# Patient Record
Sex: Male | Born: 1955 | Race: Black or African American | Hispanic: No | Marital: Married | Smoking: Former smoker
Health system: Southern US, Community
[De-identification: ages and names within clinical notes are randomized; demographics above are authoritative.]

## PROBLEM LIST (undated history)

## (undated) DIAGNOSIS — B182 Chronic viral hepatitis C: Secondary | ICD-10-CM

## (undated) DIAGNOSIS — R55 Syncope and collapse: Secondary | ICD-10-CM

## (undated) DIAGNOSIS — E0590 Thyrotoxicosis, unspecified without thyrotoxic crisis or storm: Secondary | ICD-10-CM

## (undated) DIAGNOSIS — R42 Dizziness and giddiness: Secondary | ICD-10-CM

## (undated) DIAGNOSIS — J449 Chronic obstructive pulmonary disease, unspecified: Secondary | ICD-10-CM

## (undated) DIAGNOSIS — B1920 Unspecified viral hepatitis C without hepatic coma: Secondary | ICD-10-CM

## (undated) DIAGNOSIS — I509 Heart failure, unspecified: Secondary | ICD-10-CM

## (undated) DIAGNOSIS — D696 Thrombocytopenia, unspecified: Secondary | ICD-10-CM

## (undated) HISTORY — DX: Dizziness and giddiness: R42

## (undated) HISTORY — DX: Syncope and collapse: R55

## (undated) HISTORY — PX: POLYPECTOMY: SHX149

## (undated) HISTORY — DX: Unspecified viral hepatitis C without hepatic coma: B19.20

---

## 2003-10-22 ENCOUNTER — Emergency Department (HOSPITAL_COMMUNITY): Admission: EM | Admit: 2003-10-22 | Discharge: 2003-10-22 | Payer: Self-pay | Admitting: Family Medicine

## 2003-10-28 ENCOUNTER — Encounter: Admission: RE | Admit: 2003-10-28 | Discharge: 2003-10-28 | Payer: Self-pay | Admitting: Internal Medicine

## 2003-11-11 ENCOUNTER — Ambulatory Visit (HOSPITAL_COMMUNITY): Admission: RE | Admit: 2003-11-11 | Discharge: 2003-11-11 | Payer: Self-pay | Admitting: Internal Medicine

## 2004-03-10 ENCOUNTER — Emergency Department (HOSPITAL_COMMUNITY): Admission: EM | Admit: 2004-03-10 | Discharge: 2004-03-10 | Payer: Self-pay | Admitting: Emergency Medicine

## 2004-03-22 ENCOUNTER — Ambulatory Visit (HOSPITAL_COMMUNITY): Admission: RE | Admit: 2004-03-22 | Discharge: 2004-03-22 | Payer: Self-pay | Admitting: Internal Medicine

## 2006-05-23 ENCOUNTER — Ambulatory Visit (HOSPITAL_COMMUNITY): Admission: RE | Admit: 2006-05-23 | Discharge: 2006-05-23 | Payer: Self-pay | Admitting: Gastroenterology

## 2006-05-23 ENCOUNTER — Encounter (INDEPENDENT_AMBULATORY_CARE_PROVIDER_SITE_OTHER): Payer: Self-pay | Admitting: *Deleted

## 2006-08-01 ENCOUNTER — Ambulatory Visit: Payer: Self-pay | Admitting: Internal Medicine

## 2007-01-23 ENCOUNTER — Ambulatory Visit: Payer: Self-pay | Admitting: Endocrinology

## 2007-02-04 ENCOUNTER — Encounter: Admission: RE | Admit: 2007-02-04 | Discharge: 2007-02-04 | Payer: Self-pay | Admitting: Endocrinology

## 2007-06-06 ENCOUNTER — Emergency Department (HOSPITAL_COMMUNITY): Admission: EM | Admit: 2007-06-06 | Discharge: 2007-06-06 | Payer: Self-pay | Admitting: Family Medicine

## 2007-08-04 ENCOUNTER — Ambulatory Visit: Payer: Self-pay | Admitting: Internal Medicine

## 2007-08-04 DIAGNOSIS — J45901 Unspecified asthma with (acute) exacerbation: Secondary | ICD-10-CM | POA: Insufficient documentation

## 2007-08-04 DIAGNOSIS — J449 Chronic obstructive pulmonary disease, unspecified: Secondary | ICD-10-CM

## 2007-08-12 ENCOUNTER — Telehealth (INDEPENDENT_AMBULATORY_CARE_PROVIDER_SITE_OTHER): Payer: Self-pay | Admitting: *Deleted

## 2007-12-08 ENCOUNTER — Telehealth (INDEPENDENT_AMBULATORY_CARE_PROVIDER_SITE_OTHER): Payer: Self-pay | Admitting: *Deleted

## 2008-02-01 ENCOUNTER — Emergency Department (HOSPITAL_COMMUNITY): Admission: EM | Admit: 2008-02-01 | Discharge: 2008-02-01 | Payer: Self-pay | Admitting: Family Medicine

## 2008-02-05 ENCOUNTER — Emergency Department (HOSPITAL_COMMUNITY): Admission: EM | Admit: 2008-02-05 | Discharge: 2008-02-05 | Payer: Self-pay | Admitting: Emergency Medicine

## 2008-06-03 ENCOUNTER — Encounter (HOSPITAL_COMMUNITY): Admission: RE | Admit: 2008-06-03 | Discharge: 2008-06-15 | Payer: Self-pay | Admitting: Endocrinology

## 2008-06-09 ENCOUNTER — Emergency Department (HOSPITAL_COMMUNITY): Admission: EM | Admit: 2008-06-09 | Discharge: 2008-06-09 | Payer: Self-pay | Admitting: Emergency Medicine

## 2008-06-24 ENCOUNTER — Ambulatory Visit: Payer: Self-pay | Admitting: Internal Medicine

## 2008-06-24 ENCOUNTER — Ambulatory Visit: Payer: Self-pay

## 2008-06-24 ENCOUNTER — Inpatient Hospital Stay (HOSPITAL_COMMUNITY): Admission: AD | Admit: 2008-06-24 | Discharge: 2008-06-30 | Payer: Self-pay | Admitting: Internal Medicine

## 2008-06-24 ENCOUNTER — Encounter: Payer: Self-pay | Admitting: Internal Medicine

## 2008-06-24 LAB — CONVERTED CEMR LAB
ALT: 32 units/L (ref 0–53)
AST: 32 units/L (ref 0–37)
Albumin: 3.3 g/dL — ABNORMAL LOW (ref 3.5–5.2)
Alkaline Phosphatase: 115 units/L (ref 39–117)
BUN: 16 mg/dL (ref 6–23)
Basophils Absolute: 0 10*3/uL (ref 0.0–0.1)
Basophils Relative: 0 % (ref 0.0–3.0)
Bilirubin, Direct: 0.5 mg/dL — ABNORMAL HIGH (ref 0.0–0.3)
CO2: 29 meq/L (ref 19–32)
Calcium: 9.5 mg/dL (ref 8.4–10.5)
Chloride: 106 meq/L (ref 96–112)
Creatinine, Ser: 0.5 mg/dL (ref 0.4–1.5)
Eosinophils Absolute: 0 10*3/uL (ref 0.0–0.7)
Eosinophils Relative: 0.9 % (ref 0.0–5.0)
GFR calc Af Amer: 224 mL/min
GFR calc non Af Amer: 185 mL/min
Glucose, Bld: 103 mg/dL — ABNORMAL HIGH (ref 70–99)
HCT: 31.6 % — ABNORMAL LOW (ref 39.0–52.0)
Hemoglobin: 10.8 g/dL — ABNORMAL LOW (ref 13.0–17.0)
Lymphocytes Relative: 34.2 % (ref 12.0–46.0)
MCHC: 34.3 g/dL (ref 30.0–36.0)
MCV: 88.9 fL (ref 78.0–100.0)
Monocytes Absolute: 0.1 10*3/uL (ref 0.1–1.0)
Monocytes Relative: 2 % — ABNORMAL LOW (ref 3.0–12.0)
Neutro Abs: 3 10*3/uL (ref 1.4–7.7)
Neutrophils Relative %: 62.9 % (ref 43.0–77.0)
Platelets: 47 10*3/uL — CL (ref 150–400)
Potassium: 4 meq/L (ref 3.5–5.1)
Pro B Natriuretic peptide (BNP): 700 pg/mL — ABNORMAL HIGH (ref 0.0–100.0)
RBC: 3.55 M/uL — ABNORMAL LOW (ref 4.22–5.81)
RDW: 14.3 % (ref 11.5–14.6)
Sodium: 143 meq/L (ref 135–145)
TSH: 0.02 microintl units/mL — ABNORMAL LOW (ref 0.35–5.50)
Total Bilirubin: 2.4 mg/dL — ABNORMAL HIGH (ref 0.3–1.2)
Total Protein: 5.6 g/dL — ABNORMAL LOW (ref 6.0–8.3)
WBC: 4.7 10*3/uL (ref 4.5–10.5)

## 2008-06-25 ENCOUNTER — Ambulatory Visit: Payer: Self-pay | Admitting: Oncology

## 2008-06-25 ENCOUNTER — Encounter: Payer: Self-pay | Admitting: Internal Medicine

## 2008-07-02 ENCOUNTER — Ambulatory Visit: Payer: Self-pay | Admitting: Cardiology

## 2008-07-06 ENCOUNTER — Ambulatory Visit: Payer: Self-pay | Admitting: Cardiology

## 2008-07-06 ENCOUNTER — Ambulatory Visit: Payer: Self-pay | Admitting: Internal Medicine

## 2008-07-06 LAB — CONVERTED CEMR LAB
ALT: 37 units/L (ref 0–53)
AST: 31 units/L (ref 0–37)
Albumin: 3.5 g/dL (ref 3.5–5.2)
Alkaline Phosphatase: 99 units/L (ref 39–117)
BUN: 14 mg/dL (ref 6–23)
Bilirubin, Direct: 0.6 mg/dL — ABNORMAL HIGH (ref 0.0–0.3)
CO2: 31 meq/L (ref 19–32)
Calcium: 9.6 mg/dL (ref 8.4–10.5)
Chloride: 104 meq/L (ref 96–112)
Creatinine, Ser: 0.7 mg/dL (ref 0.4–1.5)
GFR calc Af Amer: 152 mL/min
GFR calc non Af Amer: 125 mL/min
Glucose, Bld: 97 mg/dL (ref 70–99)
Potassium: 4.3 meq/L (ref 3.5–5.1)
Pro B Natriuretic peptide (BNP): 623 pg/mL — ABNORMAL HIGH (ref 0.0–100.0)
Sodium: 141 meq/L (ref 135–145)
Total Bilirubin: 4.7 mg/dL — ABNORMAL HIGH (ref 0.3–1.2)
Total Protein: 5.6 g/dL — ABNORMAL LOW (ref 6.0–8.3)

## 2008-07-08 ENCOUNTER — Ambulatory Visit: Payer: Self-pay | Admitting: Internal Medicine

## 2008-07-12 ENCOUNTER — Ambulatory Visit: Payer: Self-pay | Admitting: Internal Medicine

## 2008-07-12 ENCOUNTER — Ambulatory Visit: Payer: Self-pay | Admitting: Cardiology

## 2008-07-12 LAB — CONVERTED CEMR LAB
ALT: 30 units/L (ref 0–53)
AST: 32 units/L (ref 0–37)
Albumin: 3.6 g/dL (ref 3.5–5.2)
Alkaline Phosphatase: 96 units/L (ref 39–117)
BUN: 17 mg/dL (ref 6–23)
Bilirubin, Direct: 0.6 mg/dL — ABNORMAL HIGH (ref 0.0–0.3)
CO2: 30 meq/L (ref 19–32)
Calcium: 9.5 mg/dL (ref 8.4–10.5)
Chloride: 106 meq/L (ref 96–112)
Creatinine, Ser: 0.7 mg/dL (ref 0.4–1.5)
GFR calc Af Amer: 152 mL/min
GFR calc non Af Amer: 125 mL/min
Glucose, Bld: 113 mg/dL — ABNORMAL HIGH (ref 70–99)
Potassium: 4.5 meq/L (ref 3.5–5.1)
Pro B Natriuretic peptide (BNP): 265 pg/mL — ABNORMAL HIGH (ref 0.0–100.0)
Sodium: 141 meq/L (ref 135–145)
Total Bilirubin: 3.7 mg/dL — ABNORMAL HIGH (ref 0.3–1.2)
Total Protein: 5.6 g/dL — ABNORMAL LOW (ref 6.0–8.3)

## 2008-07-22 ENCOUNTER — Encounter: Admission: RE | Admit: 2008-07-22 | Discharge: 2008-08-05 | Payer: Self-pay | Admitting: Internal Medicine

## 2008-07-23 ENCOUNTER — Ambulatory Visit: Payer: Self-pay

## 2008-07-23 ENCOUNTER — Ambulatory Visit: Payer: Self-pay | Admitting: Cardiology

## 2008-07-23 ENCOUNTER — Ambulatory Visit: Payer: Self-pay | Admitting: Internal Medicine

## 2008-07-26 ENCOUNTER — Ambulatory Visit: Payer: Self-pay | Admitting: Internal Medicine

## 2008-07-26 LAB — CONVERTED CEMR LAB
BUN: 13 mg/dL (ref 6–23)
CO2: 32 meq/L (ref 19–32)
Calcium: 9.6 mg/dL (ref 8.4–10.5)
Chloride: 105 meq/L (ref 96–112)
Creatinine, Ser: 0.7 mg/dL (ref 0.4–1.5)
GFR calc Af Amer: 152 mL/min
GFR calc non Af Amer: 125 mL/min
Glucose, Bld: 105 mg/dL — ABNORMAL HIGH (ref 70–99)
HCT: 31.1 % — ABNORMAL LOW (ref 39.0–52.0)
Hemoglobin: 10.5 g/dL — ABNORMAL LOW (ref 13.0–17.0)
MCHC: 33.6 g/dL (ref 30.0–36.0)
MCV: 90.4 fL (ref 78.0–100.0)
Platelets: 54 10*3/uL — ABNORMAL LOW (ref 150–400)
Potassium: 4.1 meq/L (ref 3.5–5.1)
Pro B Natriuretic peptide (BNP): 585 pg/mL — ABNORMAL HIGH (ref 0.0–100.0)
RBC: 3.43 M/uL — ABNORMAL LOW (ref 4.22–5.81)
RDW: 15.4 % — ABNORMAL HIGH (ref 11.5–14.6)
Sodium: 141 meq/L (ref 135–145)
WBC: 3.4 10*3/uL — ABNORMAL LOW (ref 4.5–10.5)

## 2008-08-05 ENCOUNTER — Ambulatory Visit: Payer: Self-pay | Admitting: Internal Medicine

## 2008-08-05 LAB — CONVERTED CEMR LAB
BUN: 13 mg/dL (ref 6–23)
Basophils Absolute: 0.1 10*3/uL (ref 0.0–0.1)
Basophils Relative: 2.7 % (ref 0.0–3.0)
CO2: 30 meq/L (ref 19–32)
Calcium: 9.3 mg/dL (ref 8.4–10.5)
Chloride: 101 meq/L (ref 96–112)
Creatinine, Ser: 0.6 mg/dL (ref 0.4–1.5)
Eosinophils Absolute: 0 10*3/uL (ref 0.0–0.7)
Eosinophils Relative: 0.8 % (ref 0.0–5.0)
Free T4: 5.8 ng/dL — ABNORMAL HIGH (ref 0.6–1.6)
GFR calc Af Amer: 181 mL/min
GFR calc non Af Amer: 150 mL/min
Glucose, Bld: 85 mg/dL (ref 70–99)
HCT: 29.7 % — ABNORMAL LOW (ref 39.0–52.0)
Hemoglobin: 10.3 g/dL — ABNORMAL LOW (ref 13.0–17.0)
Lymphocytes Relative: 62.9 % — ABNORMAL HIGH (ref 12.0–46.0)
MCHC: 34.6 g/dL (ref 30.0–36.0)
MCV: 90.2 fL (ref 78.0–100.0)
Monocytes Absolute: 0.1 10*3/uL (ref 0.1–1.0)
Monocytes Relative: 1.8 % — ABNORMAL LOW (ref 3.0–12.0)
Neutro Abs: 1 10*3/uL — ABNORMAL LOW (ref 1.4–7.7)
Neutrophils Relative %: 31.8 % — ABNORMAL LOW (ref 43.0–77.0)
Platelets: 62 10*3/uL — ABNORMAL LOW (ref 150–400)
Potassium: 3.7 meq/L (ref 3.5–5.1)
Pro B Natriuretic peptide (BNP): 560 pg/mL — ABNORMAL HIGH (ref 0.0–100.0)
RBC: 3.29 M/uL — ABNORMAL LOW (ref 4.22–5.81)
RDW: 14.8 % — ABNORMAL HIGH (ref 11.5–14.6)
Sodium: 138 meq/L (ref 135–145)
T3, Free: 28.4 pg/mL — ABNORMAL HIGH (ref 2.3–4.2)
TSH: 0.03 microintl units/mL — ABNORMAL LOW (ref 0.35–5.50)
WBC: 3 10*3/uL — ABNORMAL LOW (ref 4.5–10.5)

## 2008-08-06 ENCOUNTER — Ambulatory Visit: Payer: Self-pay | Admitting: Oncology

## 2008-08-10 LAB — CBC WITH DIFFERENTIAL/PLATELET
Eosinophils Absolute: 0.1 10*3/uL (ref 0.0–0.5)
MCV: 86.2 fL (ref 79.3–98.0)
MONO%: 15.5 % — ABNORMAL HIGH (ref 0.0–14.0)
NEUT#: 2 10*3/uL (ref 1.5–6.5)
RBC: 3.27 10*6/uL — ABNORMAL LOW (ref 4.20–5.82)
RDW: 14.6 % (ref 11.0–14.6)
WBC: 3.4 10*3/uL — ABNORMAL LOW (ref 4.0–10.3)

## 2008-08-10 LAB — TECHNOLOGIST REVIEW

## 2008-08-11 ENCOUNTER — Encounter: Payer: Self-pay | Admitting: Internal Medicine

## 2008-08-11 ENCOUNTER — Ambulatory Visit: Payer: Self-pay

## 2008-08-12 LAB — COMPREHENSIVE METABOLIC PANEL
ALT: 19 U/L (ref 0–53)
AST: 23 U/L (ref 0–37)
Alkaline Phosphatase: 102 U/L (ref 39–117)
CO2: 24 mEq/L (ref 19–32)
Sodium: 140 mEq/L (ref 135–145)
Total Bilirubin: 3 mg/dL — ABNORMAL HIGH (ref 0.3–1.2)
Total Protein: 5.6 g/dL — ABNORMAL LOW (ref 6.0–8.3)

## 2008-08-12 LAB — IRON AND TIBC
Iron: 68 ug/dL (ref 42–165)
TIBC: 265 ug/dL (ref 215–435)
UIBC: 197 ug/dL

## 2008-08-12 LAB — IMMUNOFIXATION ELECTROPHORESIS

## 2008-08-12 LAB — ANA: Anti Nuclear Antibody(ANA): NEGATIVE

## 2008-08-12 LAB — RHEUMATOID FACTOR: Rhuematoid fact SerPl-aCnc: <20 IU/mL (ref 0–20)

## 2008-08-13 ENCOUNTER — Ambulatory Visit: Payer: Self-pay | Admitting: Cardiology

## 2008-08-20 ENCOUNTER — Ambulatory Visit: Payer: Self-pay | Admitting: Internal Medicine

## 2008-08-20 LAB — CONVERTED CEMR LAB
BUN: 14 mg/dL (ref 6–23)
Basophils Absolute: 0 10*3/uL (ref 0.0–0.1)
Basophils Relative: 0 % (ref 0.0–3.0)
CO2: 31 meq/L (ref 19–32)
Calcium: 9.7 mg/dL (ref 8.4–10.5)
Chloride: 105 meq/L (ref 96–112)
Creatinine, Ser: 0.7 mg/dL (ref 0.4–1.5)
Eosinophils Absolute: 0 10*3/uL (ref 0.0–0.7)
Eosinophils Relative: 0.7 % (ref 0.0–5.0)
GFR calc Af Amer: 152 mL/min
GFR calc non Af Amer: 125 mL/min
Glucose, Bld: 97 mg/dL (ref 70–99)
HCT: 30.1 % — ABNORMAL LOW (ref 39.0–52.0)
Hemoglobin: 10 g/dL — ABNORMAL LOW (ref 13.0–17.0)
INR: 2.9 — ABNORMAL HIGH (ref 0.8–1.0)
Lymphocytes Relative: 72.8 % — ABNORMAL HIGH (ref 12.0–46.0)
MCHC: 33.3 g/dL (ref 30.0–36.0)
MCV: 91.3 fL (ref 78.0–100.0)
Monocytes Absolute: 0.1 10*3/uL (ref 0.1–1.0)
Monocytes Relative: 2.2 % — ABNORMAL LOW (ref 3.0–12.0)
Neutro Abs: 1.2 10*3/uL — ABNORMAL LOW (ref 1.4–7.7)
Platelets: 59 10*3/uL — ABNORMAL LOW (ref 150–400)
Potassium: 4.1 meq/L (ref 3.5–5.1)
Prothrombin Time: 29.9 s — ABNORMAL HIGH (ref 10.9–13.3)
RBC: 3.29 M/uL — ABNORMAL LOW (ref 4.22–5.81)
RDW: 14.5 % (ref 11.5–14.6)
Sodium: 144 meq/L (ref 135–145)
WBC: 4.1 10*3/uL — ABNORMAL LOW (ref 4.5–10.5)

## 2008-08-23 ENCOUNTER — Ambulatory Visit: Payer: Self-pay | Admitting: Internal Medicine

## 2008-08-23 ENCOUNTER — Inpatient Hospital Stay (HOSPITAL_BASED_OUTPATIENT_CLINIC_OR_DEPARTMENT_OTHER): Admission: RE | Admit: 2008-08-23 | Discharge: 2008-08-23 | Payer: Self-pay | Admitting: Internal Medicine

## 2008-08-31 ENCOUNTER — Ambulatory Visit (HOSPITAL_COMMUNITY): Admission: RE | Admit: 2008-08-31 | Discharge: 2008-08-31 | Payer: Self-pay | Admitting: Oncology

## 2008-08-31 ENCOUNTER — Encounter: Payer: Self-pay | Admitting: Oncology

## 2008-08-31 ENCOUNTER — Ambulatory Visit: Payer: Self-pay | Admitting: Oncology

## 2008-08-31 ENCOUNTER — Ambulatory Visit: Payer: Self-pay | Admitting: Internal Medicine

## 2008-08-31 ENCOUNTER — Emergency Department (HOSPITAL_COMMUNITY): Admission: EM | Admit: 2008-08-31 | Discharge: 2008-08-31 | Payer: Self-pay | Admitting: Emergency Medicine

## 2008-09-01 ENCOUNTER — Ambulatory Visit (HOSPITAL_COMMUNITY): Admission: RE | Admit: 2008-09-01 | Discharge: 2008-09-01 | Payer: Self-pay | Admitting: Internal Medicine

## 2008-09-01 ENCOUNTER — Encounter: Payer: Self-pay | Admitting: Internal Medicine

## 2008-09-01 ENCOUNTER — Encounter: Payer: Self-pay | Admitting: Emergency Medicine

## 2008-09-02 ENCOUNTER — Ambulatory Visit: Payer: Self-pay | Admitting: Internal Medicine

## 2008-09-06 ENCOUNTER — Encounter (HOSPITAL_COMMUNITY): Admission: RE | Admit: 2008-09-06 | Discharge: 2008-12-05 | Payer: Self-pay | Admitting: Endocrinology

## 2008-09-07 LAB — CBC WITH DIFFERENTIAL/PLATELET
BASO%: 0.2 % (ref 0.0–2.0)
LYMPH%: 26.2 % (ref 14.0–49.0)
MCHC: 33.3 g/dL (ref 32.0–36.0)
MONO#: 0.7 10*3/uL (ref 0.1–0.9)
RBC: 3.2 10*6/uL — ABNORMAL LOW (ref 4.20–5.82)
RDW: 14.3 % (ref 11.0–14.6)
WBC: 4.5 10*3/uL (ref 4.0–10.3)
lymph#: 1.2 10*3/uL (ref 0.9–3.3)
nRBC: 0 % (ref 0–0)

## 2008-09-10 ENCOUNTER — Ambulatory Visit: Payer: Self-pay | Admitting: Internal Medicine

## 2008-09-10 ENCOUNTER — Encounter: Payer: Self-pay | Admitting: Internal Medicine

## 2008-09-10 ENCOUNTER — Ambulatory Visit: Payer: Self-pay | Admitting: Cardiology

## 2008-09-10 DIAGNOSIS — I5032 Chronic diastolic (congestive) heart failure: Secondary | ICD-10-CM

## 2008-09-10 DIAGNOSIS — I4891 Unspecified atrial fibrillation: Secondary | ICD-10-CM

## 2008-09-10 DIAGNOSIS — R197 Diarrhea, unspecified: Secondary | ICD-10-CM | POA: Insufficient documentation

## 2008-09-10 DIAGNOSIS — E0590 Thyrotoxicosis, unspecified without thyrotoxic crisis or storm: Secondary | ICD-10-CM | POA: Insufficient documentation

## 2008-09-10 LAB — CONVERTED CEMR LAB
BUN: 7 mg/dL (ref 6–23)
CO2: 30 meq/L (ref 19–32)
Calcium: 9 mg/dL (ref 8.4–10.5)
Chloride: 102 meq/L (ref 96–112)
Creatinine, Ser: 0.6 mg/dL (ref 0.4–1.5)
GFR calc non Af Amer: 180.97 mL/min (ref >60)
Glucose, Bld: 97 mg/dL (ref 70–99)
Potassium: 3.5 meq/L (ref 3.5–5.1)
Pro B Natriuretic peptide (BNP): 883 pg/mL — ABNORMAL HIGH (ref 0.0–100.0)
Sodium: 140 meq/L (ref 135–145)

## 2008-09-20 ENCOUNTER — Ambulatory Visit: Payer: Self-pay | Admitting: Emergency Medicine

## 2008-09-22 ENCOUNTER — Telehealth: Payer: Self-pay | Admitting: Emergency Medicine

## 2008-10-12 ENCOUNTER — Ambulatory Visit: Payer: Self-pay | Admitting: Cardiology

## 2008-10-12 ENCOUNTER — Ambulatory Visit: Payer: Self-pay | Admitting: Internal Medicine

## 2008-10-12 ENCOUNTER — Ambulatory Visit: Payer: Self-pay | Admitting: Oncology

## 2008-10-13 ENCOUNTER — Telehealth (INDEPENDENT_AMBULATORY_CARE_PROVIDER_SITE_OTHER): Payer: Self-pay | Admitting: *Deleted

## 2008-10-15 ENCOUNTER — Encounter: Payer: Self-pay | Admitting: Internal Medicine

## 2008-10-20 ENCOUNTER — Encounter (INDEPENDENT_AMBULATORY_CARE_PROVIDER_SITE_OTHER): Payer: Self-pay | Admitting: *Deleted

## 2008-10-20 ENCOUNTER — Ambulatory Visit: Payer: Self-pay | Admitting: Emergency Medicine

## 2008-10-22 ENCOUNTER — Ambulatory Visit: Payer: Self-pay | Admitting: Cardiovascular Disease

## 2008-11-05 ENCOUNTER — Ambulatory Visit: Payer: Self-pay | Admitting: Cardiology

## 2008-11-16 ENCOUNTER — Telehealth: Payer: Self-pay | Admitting: Internal Medicine

## 2008-11-16 ENCOUNTER — Encounter: Payer: Self-pay | Admitting: *Deleted

## 2008-11-19 ENCOUNTER — Ambulatory Visit: Payer: Self-pay | Admitting: Cardiology

## 2008-11-19 LAB — CONVERTED CEMR LAB
POC INR: 1.6
Protime: 15.4

## 2008-11-22 ENCOUNTER — Telehealth (INDEPENDENT_AMBULATORY_CARE_PROVIDER_SITE_OTHER): Payer: Self-pay | Admitting: *Deleted

## 2008-12-02 ENCOUNTER — Ambulatory Visit: Payer: Self-pay | Admitting: Internal Medicine

## 2008-12-02 ENCOUNTER — Encounter (INDEPENDENT_AMBULATORY_CARE_PROVIDER_SITE_OTHER): Payer: Self-pay | Admitting: Pharmacist

## 2008-12-02 LAB — CONVERTED CEMR LAB
POC INR: 1.6
Protime: 15.6

## 2008-12-16 ENCOUNTER — Telehealth (INDEPENDENT_AMBULATORY_CARE_PROVIDER_SITE_OTHER): Payer: Self-pay | Admitting: *Deleted

## 2008-12-16 ENCOUNTER — Ambulatory Visit: Payer: Self-pay | Admitting: Internal Medicine

## 2008-12-16 LAB — CONVERTED CEMR LAB
POC INR: 2
Prothrombin Time: 17.3 s

## 2008-12-22 ENCOUNTER — Encounter: Payer: Self-pay | Admitting: *Deleted

## 2009-01-05 ENCOUNTER — Ambulatory Visit: Payer: Self-pay | Admitting: Internal Medicine

## 2009-01-05 DIAGNOSIS — D61818 Other pancytopenia: Secondary | ICD-10-CM | POA: Insufficient documentation

## 2009-01-07 ENCOUNTER — Telehealth (INDEPENDENT_AMBULATORY_CARE_PROVIDER_SITE_OTHER): Payer: Self-pay | Admitting: *Deleted

## 2009-01-20 ENCOUNTER — Ambulatory Visit: Payer: Self-pay | Admitting: Cardiology

## 2009-02-10 ENCOUNTER — Ambulatory Visit: Payer: Self-pay | Admitting: Internal Medicine

## 2009-02-10 ENCOUNTER — Telehealth: Payer: Self-pay | Admitting: Internal Medicine

## 2009-02-10 ENCOUNTER — Ambulatory Visit: Payer: Self-pay | Admitting: Cardiovascular Disease

## 2009-02-10 LAB — CONVERTED CEMR LAB: POC INR: 2.8

## 2009-02-16 ENCOUNTER — Telehealth: Payer: Self-pay | Admitting: Internal Medicine

## 2009-02-17 ENCOUNTER — Encounter: Payer: Self-pay | Admitting: Internal Medicine

## 2009-03-04 ENCOUNTER — Telehealth: Payer: Self-pay | Admitting: Emergency Medicine

## 2009-04-04 ENCOUNTER — Encounter: Payer: Self-pay | Admitting: Internal Medicine

## 2009-04-05 ENCOUNTER — Ambulatory Visit: Payer: Self-pay | Admitting: Internal Medicine

## 2009-04-05 DIAGNOSIS — I10 Essential (primary) hypertension: Secondary | ICD-10-CM

## 2009-04-12 ENCOUNTER — Ambulatory Visit: Payer: Self-pay | Admitting: Emergency Medicine

## 2009-08-22 ENCOUNTER — Telehealth: Payer: Self-pay | Admitting: Internal Medicine

## 2009-08-30 ENCOUNTER — Telehealth: Payer: Self-pay | Admitting: Emergency Medicine

## 2009-08-31 ENCOUNTER — Ambulatory Visit: Payer: Self-pay | Admitting: Emergency Medicine

## 2009-09-26 ENCOUNTER — Ambulatory Visit: Payer: Self-pay | Admitting: Emergency Medicine

## 2009-09-26 DIAGNOSIS — R490 Dysphonia: Secondary | ICD-10-CM

## 2010-03-03 ENCOUNTER — Telehealth: Payer: Self-pay | Admitting: Internal Medicine

## 2010-03-03 DIAGNOSIS — R079 Chest pain, unspecified: Secondary | ICD-10-CM | POA: Insufficient documentation

## 2010-03-12 IMAGING — CT CT ANGIO CHEST
2 of 7 series · 19 of 36 positions shown · IV contrast (APPLIED)
Comparison: Chest x-ray 06/24/2008

CLINICAL DATA: Chest pain and tachycardia.  History of Graves'
disease.

CT ANGIOGRAPHY CHEST
TECHNIQUE: Multidetector CT imaging of the chest using the
standard protocol during bolus administration of intravenous
contrast. Multiplanar reconstructed images including MIPs were
obtained and reviewed to evaluate the vascular anatomy.
Contrast: 80 ml Omnipaque 300.

[Series 6: pulm embolism 1.0 b25f thins · axial · 0.64mm/px · z∈[+1053,+1362]mm · 17 of 347 slices shown]
[im 19/347  lung]
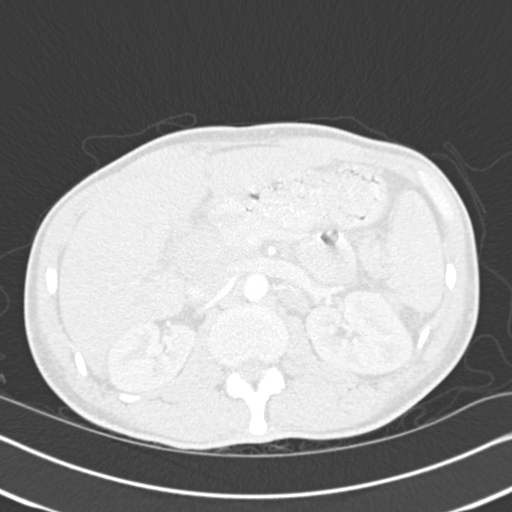
[im 37/347  mediastinal]
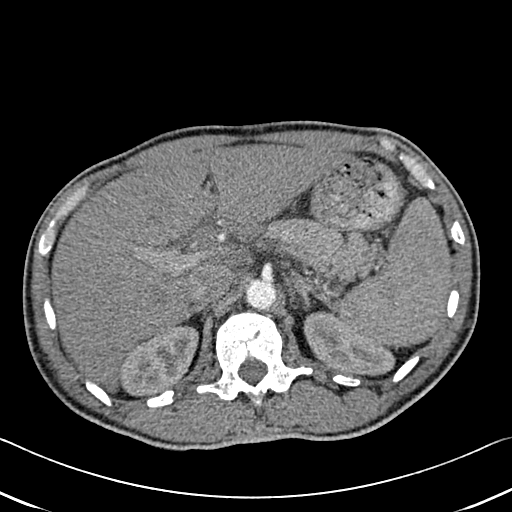
[im 55/347  lung]
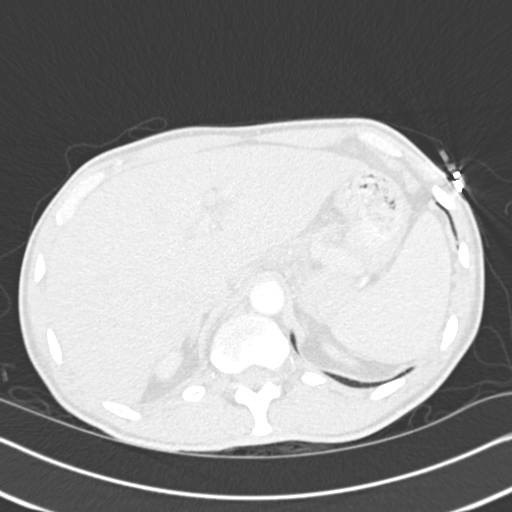
[im 73/347  mediastinal]
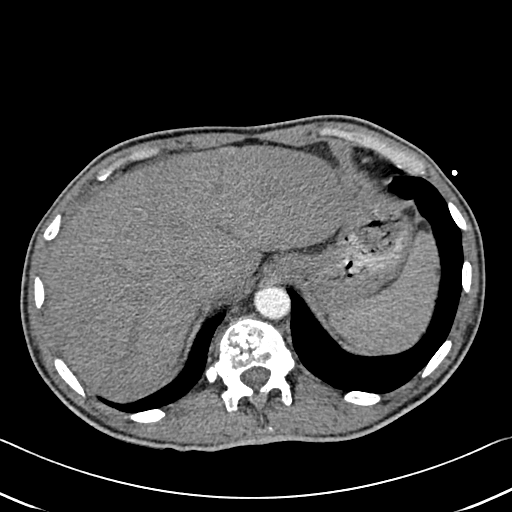
[im 92/347  lung]
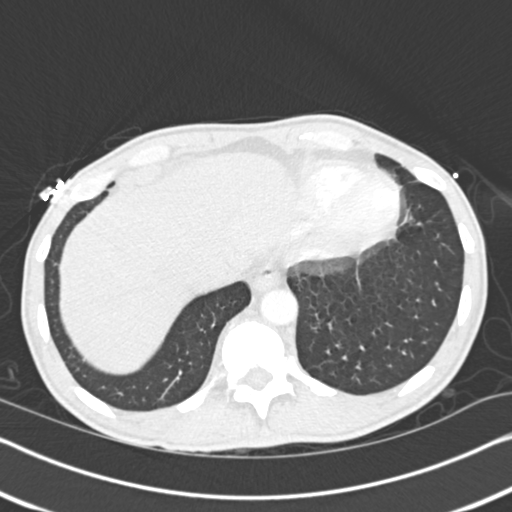
[im 110/347  mediastinal]
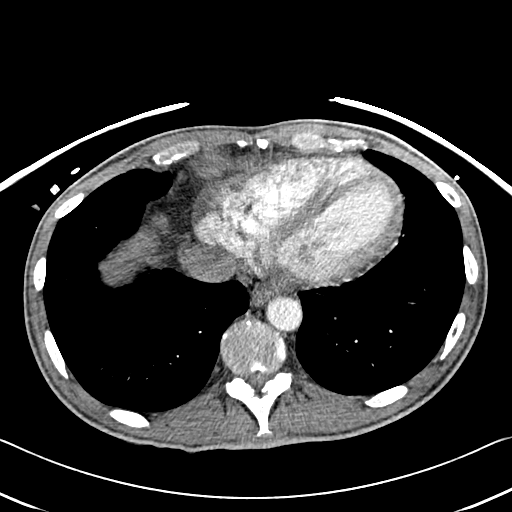
[im 128/347  lung]
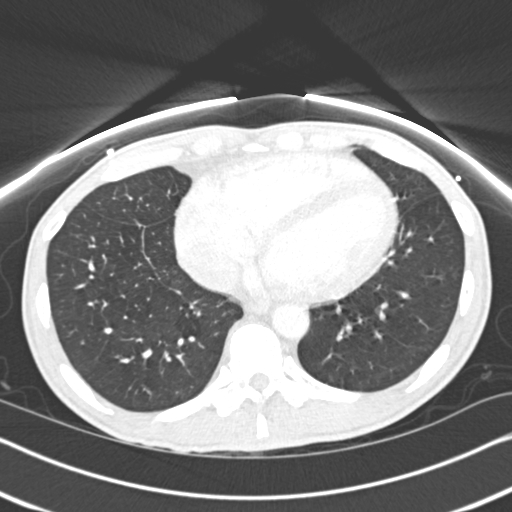
[im 146/347  mediastinal]
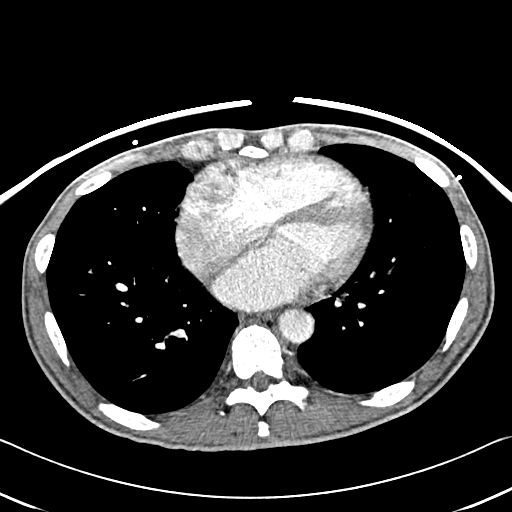
[im 183/347  lung]
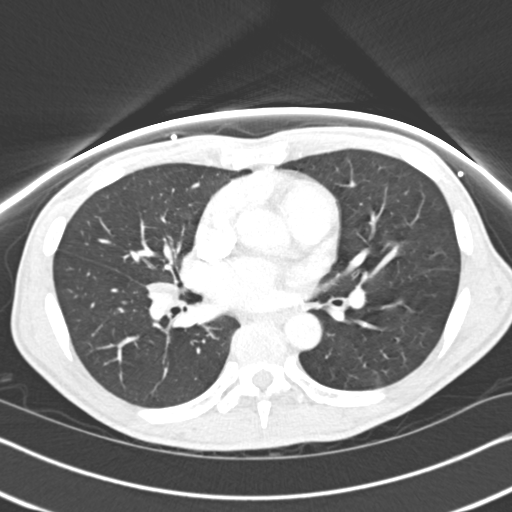
[im 201/347  mediastinal]
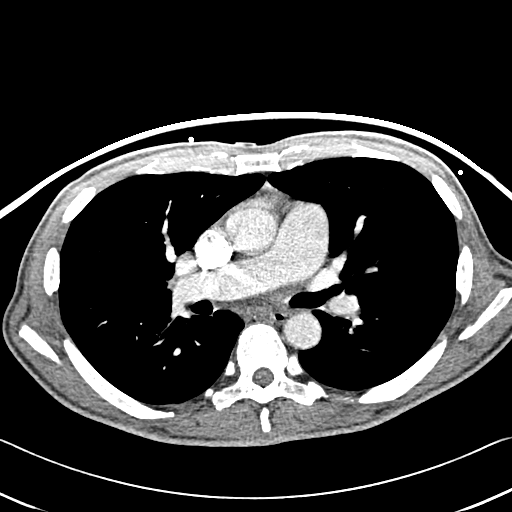
[im 219/347  lung]
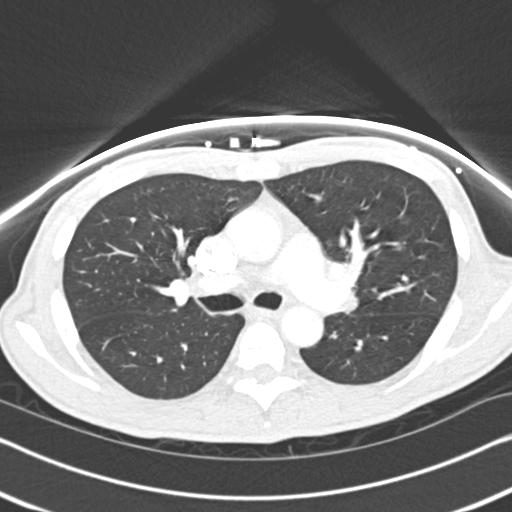
[im 237/347  mediastinal]
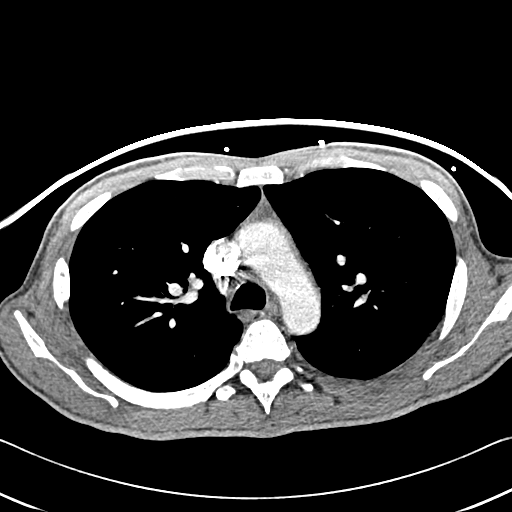
[im 255/347  lung]
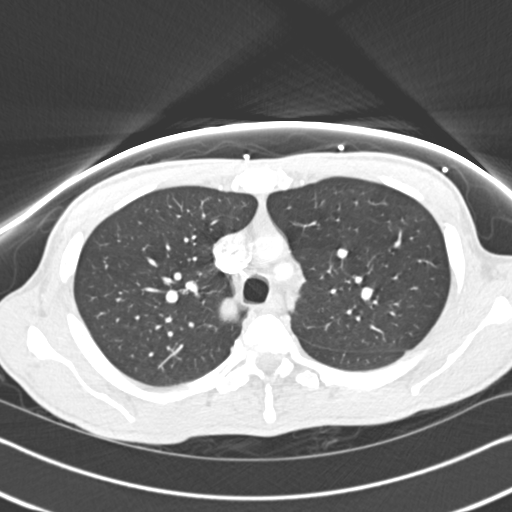
[im 274/347  mediastinal]
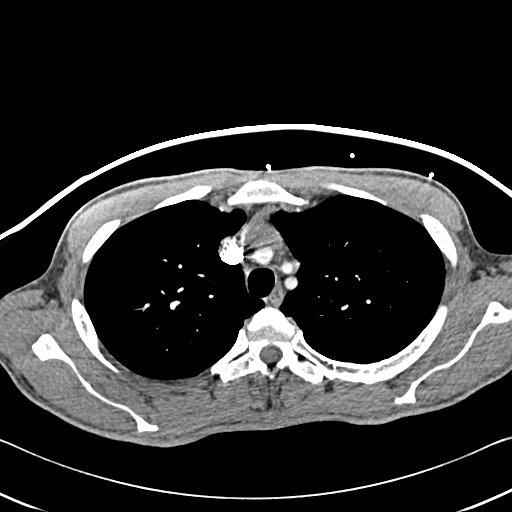
[im 292/347  lung]
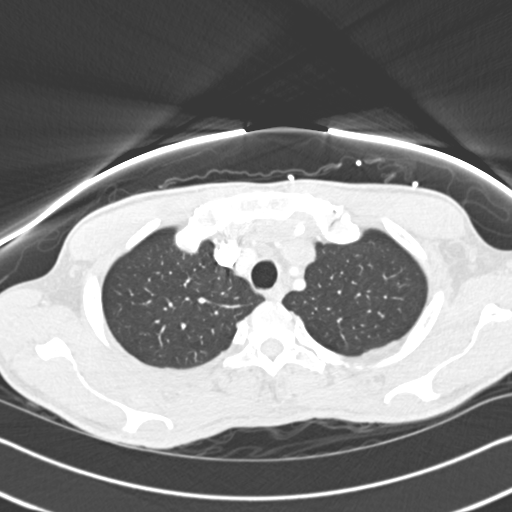
[im 310/347  mediastinal]
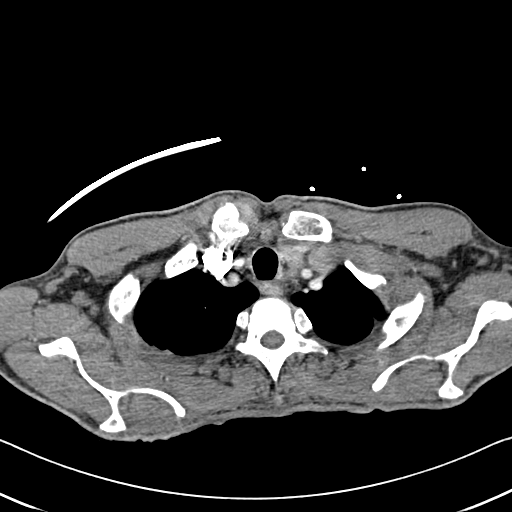
[im 328/347  lung]
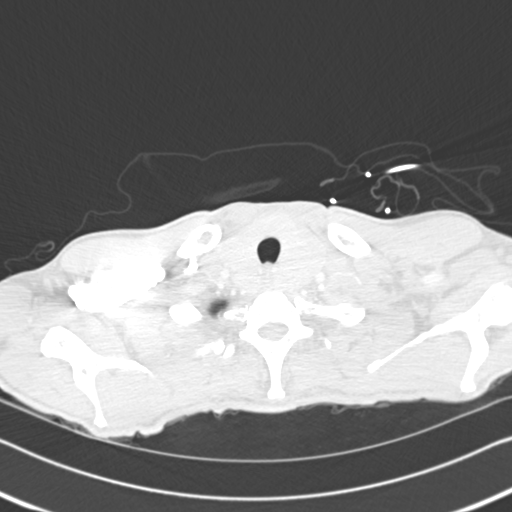

[Series 604: coronal mips · coronal · 0.68mm/px · 2 of 97 slices shown]
[im 33/97  mediastinal]
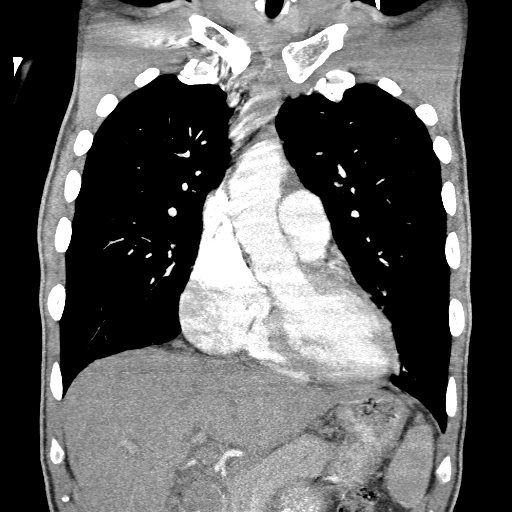
[im 65/97  mediastinal]
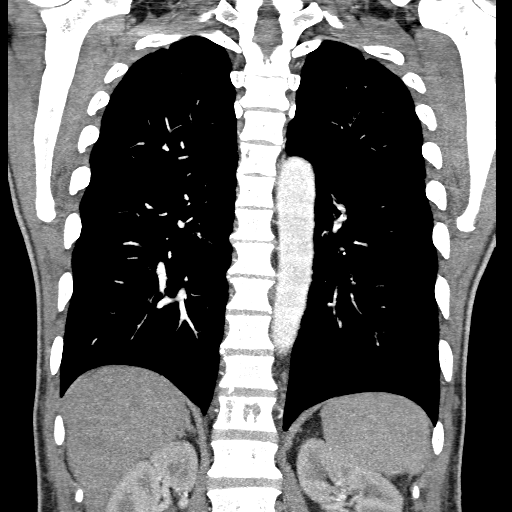

[19 of 36 positions shown; findings below may reference images not displayed]

FINDINGS: There is satisfactory opacification of the pulmonary
arterial system.  No focal filling defects are present to suggest
pulmonary embolus.  The heart size is normal.  The thyroid gland is
diffusely enlarged without focal lesion.  There is no significant
pleural or pericardial effusion.  No significant mediastinal or
axillary adenopathy is evident.  Limited imaging of the upper
abdomen is unremarkable.

The lung windows demonstrate a subpleural nodule, likely lymph node
along the right major fissure measuring 6.5 mm on image number 51.
No other focal nodule, mass, or airspace disease is evident.  Bone
windows demonstrate to and extensive hemangioma at to T12.  No
other focal lytic or blastic lesions are evident.
IMPRESSION: 1.  No acute abnormality.
2.  No evidence for pulmonary embolus.
3.  Diffuse enlargement of the thyroid gland.
4.  2.6 cm hemangioma at T12 without collapse.

## 2010-03-17 ENCOUNTER — Ambulatory Visit: Payer: Self-pay | Admitting: Internal Medicine

## 2010-03-17 ENCOUNTER — Ambulatory Visit: Payer: Self-pay

## 2010-03-23 ENCOUNTER — Encounter: Payer: Self-pay | Admitting: Internal Medicine

## 2010-03-23 ENCOUNTER — Ambulatory Visit: Payer: Self-pay | Admitting: Gastroenterology

## 2010-04-12 ENCOUNTER — Ambulatory Visit (HOSPITAL_COMMUNITY): Admission: RE | Admit: 2010-04-12 | Discharge: 2010-04-12 | Payer: Self-pay | Admitting: Gastroenterology

## 2010-06-09 ENCOUNTER — Telehealth (INDEPENDENT_AMBULATORY_CARE_PROVIDER_SITE_OTHER): Payer: Self-pay | Admitting: *Deleted

## 2010-06-16 ENCOUNTER — Ambulatory Visit
Admission: RE | Admit: 2010-06-16 | Discharge: 2010-06-16 | Payer: Self-pay | Source: Home / Self Care | Attending: Cardiovascular Disease | Admitting: Cardiovascular Disease

## 2010-06-27 ENCOUNTER — Ambulatory Visit
Admission: RE | Admit: 2010-06-27 | Discharge: 2010-06-27 | Payer: Self-pay | Source: Home / Self Care | Attending: Emergency Medicine | Admitting: Emergency Medicine

## 2010-06-29 ENCOUNTER — Encounter: Payer: Self-pay | Admitting: Emergency Medicine

## 2010-07-05 ENCOUNTER — Encounter: Payer: Self-pay | Admitting: Emergency Medicine

## 2010-07-09 ENCOUNTER — Encounter: Payer: Self-pay | Admitting: Endocrinology

## 2010-07-09 ENCOUNTER — Encounter: Payer: Self-pay | Admitting: Surgery

## 2010-07-10 ENCOUNTER — Encounter: Payer: Self-pay | Admitting: Emergency Medicine

## 2010-07-16 LAB — CONVERTED CEMR LAB
Basophils Absolute: 0 10*3/uL (ref 0.0–0.1)
CO2: 31 meq/L (ref 19–32)
Calcium: 8.6 mg/dL (ref 8.4–10.5)
Creatinine, Ser: 0.8 mg/dL (ref 0.4–1.5)
Eosinophils Absolute: 0.1 10*3/uL (ref 0.0–0.7)
Glucose, Bld: 91 mg/dL (ref 70–99)
Hemoglobin: 13.4 g/dL (ref 13.0–17.0)
Lymphocytes Relative: 35.7 % (ref 12.0–46.0)
MCHC: 32.8 g/dL (ref 30.0–36.0)
Neutro Abs: 1.8 10*3/uL (ref 1.4–7.7)
RDW: 14.8 % — ABNORMAL HIGH (ref 11.5–14.6)

## 2010-07-17 ENCOUNTER — Telehealth: Payer: Self-pay | Admitting: Emergency Medicine

## 2010-07-18 NOTE — Miscellaneous (Signed)
Summary: Orders Update pft charges  Clinical Lists Changes  Orders: Added new Service order of Carbon Monoxide diffusing w/capacity (94720) - Signed Added new Service order of Lung Volumes (94240) - Signed Added new Service order of Spirometry (Pre & Post) (94060) - Signed 

## 2010-07-18 NOTE — Progress Notes (Signed)
Summary: chest pain  Phone Note Other Incoming   Summary of Call: per pts wife he has had a couple of episodes of chest pain and heart racing, discussed w/pt he states he has had 2 episodes of chest pain he didn't feel his heart racing and chest pain didn't last very long, he as though it is probably nothing to worry about, per Dr Gala Romney discussed risk and having a stress test pt is agreeable to do this.  GXT sch for 9/22 Initial call taken by: Meredith Staggers, RN,  March 03, 2010 5:13 PM  New Problems: CHEST PAIN (ICD-786.50)   New Problems: CHEST PAIN (ICD-786.50)

## 2010-07-18 NOTE — Letter (Signed)
Summary: [facility] Initial Evaluation Note   [facility] Initial Evaluation Note   Imported By: Roderic Ovens 04/17/2010 15:09:25  _____________________________________________________________________  External Attachment:    Type:   Image     Comment:   External Document

## 2010-07-18 NOTE — Progress Notes (Signed)
Summary: Needs ov  Phone Note Call from Patient Call back at Work Phone [phone]   Caller: Patient Call For: byrum Reason for Call: Talk to Nurse Summary of Call: pt has flu, taking thera flu, but only feels better after breathing treatments w/ neb.  Feels better after xopenex.  Can you recommend something to help get it out? Initial call taken by: Eugene Gavia,  August 22, 2009 10:37 AM  Follow-up for Phone Call        VS patient--Pt c/o chest congestion, productive cough with brown phlegm x 3 days. he is using xopenex and ipratropium nebs, which helps loosen phlegm some, butpt states he is having trouble getting congestion op. He denies fever. I advise pt to increase fluids and to try mucinex to help with congestion. he staes he will do so, but wanted me to ask an MD for recs as well. Advised VS out of office so I will forward to doc of day. Please advise. Carron Curie CMA  August 22, 2009 11:07 AM Nothing else to advise over the phone, needs ov asap with all meds in hand and to er in meantime if condition worsens Follow-up by: Nyoka Cowden MD,  August 22, 2009 1:09 PM  Additional Follow-up for Phone Call Additional follow up Details #1::        pt advised needs appt. he staets he will try mucinex and if no better will call for OV. Carron Curie CMA  August 22, 2009 2:33 PM

## 2010-07-18 NOTE — Progress Notes (Signed)
Summary: wants to be seen today  Phone Note Call from Patient Call back at Work Phone [phone]   Caller: Patient Call For: Shelanda Duvall Summary of Call: had flu last week, patient wants to be seen today. he feels like he has an infection. he has cough, and congestion.  Initial call taken by: Valinda Hoar,  August 30, 2009 12:26 PM  Follow-up for Phone Call        pt was offered appt on Friday w/ RB, per pt, but I don't see any opening.  He also said she was going to try to get him in sooner with NP.   Can you please help pt.  He says he has been really sick and needs relief.  [phone] Follow-up by: Eugene Gavia,  August 30, 2009 2:16 PM  Additional Follow-up for Phone Call Additional follow up Details #1::        LMTCB. Carron Curie CMA  August 30, 2009 2:31 PM  pt states he had the flu x 2 weeks ago. He staets he took mucunex and felt better for about a week., but has no began to have increased congestion, productive cough with brown phlegm. Pt request OV. Advised pt RB has no available appts but MW has an appt available at 9:40 tomorrow in AM. Pt aware. appt schedueld. Pt advised to bring in meds to appt Carron Curie CMA  August 30, 2009 2:46 PM

## 2010-07-18 NOTE — Assessment & Plan Note (Signed)
Summary: COPD/asthma, hoarse voice   Visit Type:  Follow-up Copy to:  [facility] Primary Provider/Referring Provider:  Dr. Adrian Prince  CC:  Pt here for follow up. Pt states breathing is better than a month ago but is worse since October. Pt feels increased weight gain causing breathing issues. Pt states has been losing voice x weeks now.  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 Had repeat thyroid ablation (Dr Talmage Nap), and since then A fib and diastolic dysfxn improved. Has been released by Dr Gala Romney with these improvements.   [name] 10/20/08 -- eval by  Dr Gala Romney, decreased lasix and propanolol.  Prednisone has been finished. Since lasix decreased has had some worsening dyspnea - woke him from sleep the other day. Also with some worsening LE edema. Using scheduled xopenex and atrovent nebs.   [name] 04/12/09 -- Continues to use Symbicort two times a day, not sure that it helps him but he uses it reliably. Uses xopenex occasionally, especially when he cuts the grass. rec wean off symbicort and did worse off so restart and since then no need for xopenex routinely.  August 31, 2009 Acute visit.  Pt c/o chest congestion x  2wks.  He also states that he has had fever/chills and aches.  Pt also c/o prod cough with thick clear sputum.    Misunderstood instructions and stopped symbicort at onset and changed to xopenex.  [name] 09/26/09 -- Was seen acutely in 3/11 as above in setting URI. Now feeling better after rx with prednisone. He is still having a globus sensation, no PND, minimal cough. Not sure whether the Symbicort is related to the UA irritation. Also notes that 6 months ago he noticed a red, sometimes  dried rash on his R forearm. Not pigmented or brown. He was asked to take prilosec but has not done so.         Current Medications (verified): 1)  Xopenex 0.63 Mg/50ml Nebu (Levalbuterol Hcl) .... Inhale 1 Vial Via Hhn Four Times A Day As Needed For Shortness of Breath 2)  Ambien 10 Mg Tabs (Zolpidem Tartrate) .... At Bedtime As Needed 3)  Symbicort 160-4.5 Mcg/act  Aero (Budesonide-Formoterol Fumarate) .... Two Puffs Twice Daily, Rinse Out Your Mouth After Using  As Needed 4)  Synthroid 50 Mcg Tabs (Levothyroxine Sodium) .... Take One Tablet By Mouth Once Daily Except On Sundays 5)  Motrin Ib 200 Mg Tabs (Ibuprofen) .... As Directed As Needed  Allergies (verified): 1)  ! * Theodur 2)  ! * Methomazole  Vital Signs:  Patient profile:   55 year old male Height:      69 inches Weight:      186 pounds O2 Sat:      94 % on Room air Temp:     98 .2 degrees F oral Pulse rate:   72 / minute BP sitting:   124 / 90  (left arm) Cuff size:   regular  Vitals Entered By: Zackery Barefoot CMA (September 26, 2009 1:30 PM)  O2 Flow:  Room air CC: Pt here for follow up. Pt states breathing is better than a month ago but is worse since October. Pt feels increased weight gain causing breathing issues. Pt states has been losing  voice x weeks now Comments Medications reviewed with patient Verified contact number and pharmacy with patient Zackery Barefoot CMA  September 26, 2009 1:30 PM    Physical Exam  General:  no distress Head:  normocephalic and atraumatic Eyes:  conjunctiva and sclera clear Nose:  no deformity, discharge, inflammation, or lesions Mouth:  posterior pharynx erythematous Neck:  no masses, thyromegaly, or abnormal cervical nodes Lungs:  clear during normal resp cycle, no wheeze; he does have UA noise on a forced exp Heart:  regular, S1, loud S2,  Abdomen:  not examined Msk:  no deformity or scoliosis noted with normal posture Extremities:  no edema Neurologic:  non-focal Skin:   no rash on R forearm currently Psych:  alert and cooperative; normal mood and affect; normal attention span and concentration   Pulmonary Function Test Date: 09/26/2009 Height (in.): 70 Gender: Male  Pre-Spirometry FVC    Value: 2.54 L/min   Pred: 4.77 L/min     % Pred: 53 % FEV1    Value: 1.18 L     Pred: 3.45 L     % Pred: 34 % FEV1/FVC  Value: 47 %     Pred: 72 %     % Pred: - % FEF 25-75  Value: 0.39 L/min   Pred: 3.37 L/min     % Pred: 12 %  Post-Spirometry FVC    Value: 3.39 L/min   Pred: 4.77 L/min     % Pred: 71 % FEV1    Value: 1.46 L     Pred: 3.45 L     % Pred: 42 % FEV1/FVC  Value: 43 %     Pred: 72 %     % Pred: - % FEF 25-75  Value: 0.44 L/min   Pred: 3.37 L/min     % Pred: 13 %  Lung Volumes TLC    Value: 6.83 L   % Pred: 101 % RV    Value: 3.90 L   % Pred: 172 % DLCO    Value: 21.9 %   % Pred: 86 % DLCO/VA  Value: 4.23 %   % Pred: 109 %  Comments: Severe AFL, positive BD response, hyperinflation, normal DLCO. 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 Severe by PFT's. fairly well controlled - contin symbicort two times a day - xopenex as needed - consider spiriva at some point in the future  Problem # 2:  HOARSENESS (ZOX-096.04)  Suspect due to untreated GERD in aftermath of recent URI.  - start omeprazole and follow in 4 weeks or as needed   Orders: Est. Patient Level IV (54098)  Medications Added to Medication List This Visit: 1)  Synthroid 50 Mcg Tabs (Levothyroxine sodium) .... Take one tablet by mouth once daily except on sundays 2)  Omeprazole 20 Mg Cpdr (Omeprazole) .Marland Kitchen.. 1 by mouth once daily  Patient Instructions: 1)  Start omeprazole 20mg  by mouth once daily  2)  Continue your Symbicort two times a day  3)  Use Xopenex as needed.  4)  Follow with Dr Delton Coombes in 4 weeks or as needed.  Prescriptions: OMEPRAZOLE 20 MG CPDR (OMEPRAZOLE) 1 by mouth once daily  #30 x 5   Entered and Authorized by:   Leslye Peer MD   Signed by:   Leslye Peer MD on 09/26/2009   Method used:   Electronically to        [facility] [address] (retail)  [address]       [place], [place]  [address]       Ph: [phone]       Fax: [phone]   RxID:   [id number]

## 2010-07-18 NOTE — Assessment & Plan Note (Signed)
Summary: Pulmonary/ acute ext ov with HFA instruction 75% effective   Copy to:  [facility] Primary Provider/Referring Provider:  Dr. Adrian Prince  CC:  Acute visit.  Pt c/o chest congestion x  2wks.  He also states that he has had fever/chills and aches.  Pt also c/o prod cough with thick clear sputum.  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 Had repeat thyroid ablation (Dr Talmage Nap), and since then A fib and diastolic dysfxn improved. Has been released by Dr Gala Romney with these improvements.   [name] 10/20/08 -- eval by  Dr Gala Romney, decreased lasix and propanolol.  Prednisone has been finished. Since lasix decreased has had some worsening dyspnea - woke him from sleep the other day. Also with some worsening LE edema. Using scheduled xopenex and atrovent nebs.   [name] 04/12/09 -- Continues to use Symbicort two times a day, not sure that it helps him but he uses it reliably. Uses xopenex occasionally, especially when he cuts the grass. rec wean off symbicort and did worse off so restart and since then no need for xopenex routinely.  August 31, 2009 Acute visit.  Pt c/o chest congestion x  2wks.  He also states that he has had fever/chills and aches.  Pt also c/o prod cough with thick clear sputum.    Misunderstood instructions and stopped symbicort at onset and changed to xopenex.  Pt denies any significant sore throat, dysphagia, itching, sneezing,  nasal congestion or excess secretions,  shaking chills, sweats, unintended wt loss, pleuritic or exertional cp, hempoptysis, change in activity tolerance  orthopnea pnd or leg swelling. Pt also denies any obvious fluctuation in symptoms with weather or  environmental change or other alleviating or aggravating factors.         Current Medications (verified): 1)  Xopenex 0.63 Mg/59ml Nebu (Levalbuterol Hcl) .... Inhale 1 Vial Via Hhn Four Times A Day As Needed For Shortness of Breath 2)  Ambien 10 Mg Tabs (Zolpidem Tartrate) .... At Bedtime As Needed 3)  Symbicort 160-4.5 Mcg/act  Aero (Budesonide-Formoterol Fumarate) .... Two Puffs Twice Daily, Rinse Out Your Mouth After Using  As Needed 4)  Synthroid 50 Mcg Tabs (Levothyroxine Sodium) .... Take One Tablet By Mouth Once Daily. 5)  Motrin Ib 200 Mg Tabs (Ibuprofen) .... As Directed As Needed  Allergies (verified): 1)  ! * Theodur 2)  ! * Methomazole  Past History:  Past Medical History:  1. 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  s/p I-131 ablation 12/09 and then again 09/17/2008       b.  unable to tolerate PTU or methimazole due to pancytopenia  2. High output heart failure      a. ECHO 2/10: EF 55-60%. mild to moderate MR. Mild to moderate RV dysfx      b. RCH 3/10:  RA 19, PA 70/22 (43), PCWP 23 with v = 38 Fick 6.0/3.4 PVR 3.3   3. PAF       a. now on Tikosyn   4. Severe COPD/Asthma        - HFA 50% August 31, 2009   5. h/o polysubstance abise  6. Pancytopenia s/p BM Bx 3/10.  Vital Signs:  Patient profile:   55 year old male Weight:      185 pounds O2 Sat:      96 % on Room air Temp:     97.3 degrees F oral Pulse rate:   73 / minute BP sitting:   134 / 82  (left arm)  Vitals Entered By: Vernie Murders (August 31, 2009 9:46 AM)  O2 Flow:  Room air  Physical Exam  Additional Exam:  amb somber bm nad   HEENT mild turbinate edema.  Oropharynx no thrush or excess pnd or cobblestoning.  No JVD or cervical adenopathy. Mild accessory muscle hypertrophy. Trachea midline, nl thryroid. Chest was hyperinflated by percussion with diminished breath sounds and moderate increased exp time without wheeze. Hoover sign positive at mid inspiration. Regular rate and rhythm  without murmur gallop or rub or increase P2 or edema.  Abd: no hsm, nl excursion. Ext warm without cyanosis or clubbing.     Impression & Recommendations:  Problem # 1:  COPD (ICD-496) Clinically mild with ? ab component worse with uri and off symbicort  I spent extra time with the patient today explaining optimal mdi  technique.  This improved from  50-75%  DDX of  difficult airways managment all start with A and  include Adherence, Ace Inhibitors, Acid Reflux, Active Sinus Disease, Alpha 1 Antitripsin deficiency, Anxiety masquerading as Airways dz,  ABPA,  allergy(esp in young), Aspiration (esp in elderly), Adverse effects of DPI,  Active smokers, plus one B  = Beta blocker use..     Adherence:  reinforce approp use of mdi  Acid reflux;  empriric rx until returns for baseline pft's  Medications Added to Medication List This Visit: 1)  Motrin Ib 200 Mg Tabs (Ibuprofen) .... As directed as needed 2)  Prednisone 10 Mg Tabs (Prednisone) .... 4 each am x 2days, 2x2days, 1x2days and stop 3)  Mucinex Dm 30-600 Mg Xr12h-tab (Dextromethorphan-guaifenesin) .Marland Kitchen.. 1-2 every 12 hours as needed for cough and congestion 4)  Prilosec Otc 20 Mg Tbec (Omeprazole magnesium) .... Take one 30-60 min before first and last meals of the day while coughing  Other Orders: Est. Patient Level IV (72536)  Patient Instructions: 1)  Stay on symbicort 160 2 puffs first thing  in am and 2 puffs again in pm about 12 hours later  2)  supplement with xopenex every 4 hours if needed  3)  Prednisone 4 each am x 2days, 2x2days, 1x2days and stop  4)  Prilosec Take one 30-60 min before first and last meals of the day while coughing to reduce the risk of acid production aggravating the cough (like oxygen fuels a fire)  5)  GERD (REFLUX)  is a common cause of respiratory symptoms. It commonly presents without heartburn and can be treated with medication, but also with lifestyle changes including avoidance of late meals,  excessive alcohol, smoking cessation, and avoid fatty foods, chocolate, peppermint, colas, red wine, and acidic juices such as orange juice. NO MINT OR MENTHOL PRODUCTS SO NO COUGH DROPS  6)  USE SUGARLESS CANDY INSTEAD (jolley ranchers)  7)  NO OIL BASED VITAMINS  8)  Schedule PFT's at least 2 weeks from now 9)  You did not want a cxr today and have no evidence of pna if you worsen at all on this regimen you need cxr and maybe antibiotics Prescriptions: PREDNISONE 10 MG  TABS (PREDNISONE) 4 each am x 2days, 2x2days, 1x2days and stop  #14 x 0  Entered and Authorized by:   Nyoka Cowden MD   Signed by:   Nyoka Cowden MD on 08/31/2009   Method used:   Electronically to        [facility] [address] (retail)       [phone]       [place], [place]  [address]       Ph: [phone]       Fax: [phone]   RxID:   [id number]

## 2010-07-20 NOTE — Assessment & Plan Note (Signed)
Summary: COPD/asthma   Visit Type:  Follow-up Copy to:  Bensimhon Primary Provider/Referring Provider:  Dr. Adrian Prince  CC:  COPD...the patient has no complaints today...needs refill on Symbicort.  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 Had repeat thyroid ablation (Dr Talmage Nap), and since then A fib and diastolic dysfxn improved. Has been released by Dr Gala Romney with these improvements.   [name] 04/12/09 -- Continues to use Symbicort two times a day, not sure that it helps him but he uses it reliably. Uses xopenex occasionally, especially when he cuts the grass. rec wean off symbicort and did worse off so restart and since then no need for xopenex routinely.  August 31, 2009 Acute visit.  Pt c/o chest congestion x  2wks.  He also states that he has had fever/chills and aches.  Pt also c/o prod cough with thick clear sputum.    Misunderstood instructions and stopped symbicort at onset and changed to xopenex.  [name] 09/26/09 -- Was seen acutely in 3/11 as above in setting URI. Now feeling better after rx with prednisone. He is still having a globus sensation, no PND, minimal cough. Not sure whether the Symbicort is related to the UA irritation. Also notes that 6 months ago he noticed a red, sometimes dried rash on his R forearm. Not pigmented or brown. He was asked to take prilosec but has not done so.   [name] 06/27/10 -- follows up for asthma/COPD, allergies and congestion. He has been doing well, going to the gym biw. Breathing has been good, doesn't need SABA. Has 3 cats and a dog - bothering allergies, bothering breathing some. He is looking to get an air purifier, hopes to avoid going on allergy meds. No  flares, no hospitalizations since last time.  Preventive Screening-Counseling & Management  Alcohol-Tobacco     Smoking Status: quit > 6 months     Packs/Day: 1.0     Year Started: 1970     Year Quit: 1989  Current Medications (verified): 1)  Xopenex 0.63 Mg/47ml Nebu (Levalbuterol Hcl) .... Inhale 1 Vial Via Hhn Four Times A Day As Needed For Shortness of Breath 2)  Symbicort 160-4.5 Mcg/act  Aero (Budesonide-Formoterol Fumarate) .... Two Puffs Twice Daily, Rinse Out Your Mouth After Using  As Needed 3)  Synthroid 50 Mcg Tabs (Levothyroxine Sodium) .... Take One Tablet By Mouth Once Daily Except On Sundays 4)  Amlodipine Besylate 5 Mg Tabs (Amlodipine Besylate) .... Take 1/2 Tablet By Mouth Daily  Allergies (verified): 1)  ! * Theodur 2)  ! * Methomazole  Social History: Smoking Status:  quit > 6 months Packs/Day:  1.0  Vital Signs:  Patient profile:   54 year old male Height:      69 inches (175.26 cm) Weight:      197 pounds (89.55 kg) BMI:     29.20 O2 Sat:      96 % on Room air Temp:     98 .0 degrees F (36.67 degrees C) oral Pulse rate:   55 / minute BP sitting:   120 / 80  (right arm) Cuff size:   regular  Vitals Entered By: Michel Bickers CMA (June 27, 2010 3:25 PM)  O2 Sat at Rest %:  96 O2 Flow:  Room air CC: COPD...the patient has no complaints today...needs refill on Symbicort Comments Medications reviewed with patient Michel Bickers CMA  June 27, 2010 3:25 PM   Physical Exam  General:  no distress Head:  normocephalic and atraumatic Eyes:  conjunctiva and sclera clear Nose:  no deformity, discharge, inflammation, or lesions Mouth:  posterior pharynx erythematous Neck:  no masses, thyromegaly, or abnormal cervical nodes Lungs:  clear during normal resp cycle, no wheeze; Heart:  regular, S1, loud S2,  Abdomen:  not examined Msk:  no deformity or scoliosis noted with normal posture Extremities:  no edema Neurologic:  non-focal Psych:  alert and  cooperative; normal mood and affect; normal attention span and concentration   Impression & Recommendations:  Problem # 1:  COPD (ICD-496) with superimposed asthma. Doing well on current regimen. - continue symbicort two times a day  - as needed xopenex or proventil - rov 1 yr or as needed   Medications Added to Medication List This Visit: 1)  Proventil Hfa 108 (90 Base) Mcg/act Aers (Albuterol sulfate) .... 2 puffs up to every 4 hours if needed for shortness of breath  Other Orders: Est. Patient Level III ([address])  Patient Instructions: 1)  Continue your Symbicort 2 puffs two times a day  2)  Use your Xopenex or Proventil as needed  3)  Follow up with Dr Delton Coombes in 1 yr or as needed  Prescriptions: PROVENTIL HFA 108 (90 BASE) MCG/ACT AERS (ALBUTEROL SULFATE) 2 puffs up to every 4 hours if needed for shortness of breath  #1 x 5   Entered and Authorized by:   Leslye Peer MD   Signed by:   Leslye Peer MD on 06/27/2010   Method used:   Electronically to        [facility] [address] (retail)       [phone]       [place], [place]  [address]       Ph: [phone]       Fax: [phone]   RxID:   [id number] 0.63 MG/3ML NEBU (LEVALBUTEROL HCL) Inhale 1 vial via HHN four times a day as needed for shortness of breath  #120 x 3   Entered and Authorized by:   Leslye Peer MD   Signed by:   Leslye Peer MD on 06/27/2010   Method used:   Electronically to        [facility] [address] (retail)       [phone]       [place], [place]  [address]       Ph: [phone]       Fax: [phone]   RxID:   [id number] SYMBICORT 160-4.5 MCG/ACT  AERO (BUDESONIDE-FORMOTEROL FUMARATE) Two puffs twice daily, rinse out your mouth after using  as needed  #11 Gram x 11   Entered and Authorized by:   Leslye Peer MD   Signed by:   Leslye Peer MD on 06/27/2010   Method used:   Electronically to         [facility] [address] (retail)       [phone]       [place], [place]  [address]       Ph: [phone]       Fax: [phone]  RxID:   [id number]

## 2010-07-20 NOTE — Assessment & Plan Note (Signed)
Summary: bp check/dfg  Nurse Visit   Vital Signs:  Patient profile:   55 year old male Pulse rate:   66 / minute Pulse rhythm:   regular BP sitting:   136 / 78  (left arm) BP standing:   126 / 84  (left arm) Cuff size:   large  Vitals Entered By: Lisabeth Devoid RN (June 16, 2010 2:32 PM)   Current Medications (verified): 1)  Xopenex 0.63 Mg/26ml Nebu (Levalbuterol Hcl) .... Inhale 1 Vial Via Hhn Four Times A Day As Needed For Shortness of Breath 2)  Symbicort 160-4.5 Mcg/act  Aero (Budesonide-Formoterol Fumarate) .... Two Puffs Twice Daily, Rinse Out Your Mouth After Using  As Needed 3)  Synthroid 50 Mcg Tabs (Levothyroxine Sodium) .... Take One Tablet By Mouth Once Daily Except On Sundays 4)  Amlodipine Besylate 5 Mg Tabs (Amlodipine Besylate) .... Take 1/2 Tablet By Mouth Daily  Allergies (verified): 1)  ! * Theodur 2)  ! * Methomazole

## 2010-07-20 NOTE — Progress Notes (Signed)
Summary: symbicort refill and new neb machine  Phone Note Call from Patient Call back at Work Phone [phone]   Caller: Patient Call For: byrum Summary of Call: pt has appt on 1/10 needs refill on symbicort Initial call taken by: Lacinda Axon,  June 09, 2010 8:12 AM  Follow-up for Phone Call        called spoke with patient, advised he may have 1 refill on the symbicort but must keep his upcoming appt on 1.10.12 for further refills.  pt okay with this and verbalized his understanding.  rx sent to [facility] [address]  pt also requests an order for a nebulizer machine.  he states that the machine he has been using has been borrowed from a family member.  states he does not currently use a home health care company.  pt aware RB not in office until 12.28.11 and okay with this as he has been w/o a neb machine "for this long."  will forward to RB. Boone Master CNA/MA  June 09, 2010 9:47 AM     Prescriptions: SYMBICORT 160-4.5 MCG/ACT  AERO (BUDESONIDE-FORMOTEROL FUMARATE) Two puffs twice daily, rinse out your mouth after using  as needed  #11 Gram x 0   Entered by:   Boone Master CNA/MA   Authorized by:   Leslye Peer MD   Signed by:   Boone Master CNA/MA on 06/09/2010   Method used:   Electronically to        [facility] [address] (retail)       [phone]       [place], [place]  [address]       Ph: [phone]       Fax: [phone]   RxID:   [id number]

## 2010-07-20 NOTE — Letter (Signed)
Summary: Nebulizer & Supplies/[facility]  Nebulizer & Supplies/[facility]   Imported By: Sherian Rein 07/13/2010 14:49:39  _____________________________________________________________________  External Attachment:    Type:   Image     Comment:   External Document

## 2010-07-20 NOTE — Miscellaneous (Signed)
Summary: Treatment Plan/[facility]  Treatment Plan/[facility]   Imported By: Sherian Rein 07/04/2010 11:02:07  _____________________________________________________________________  External Attachment:    Type:   Image     Comment:   External Document

## 2010-07-26 NOTE — Progress Notes (Signed)
Summary: congestion  Phone Note Call from Patient   Caller: Patient Call For: Jeannia Tatro Summary of Call: pt c/o cough "mostly non-productive" x 4 days. slightly yellow mucus. no fever. has been taking musinex x 4 days. request rx. [facility] [address]. pt [phone] Initial call taken by: Tivis Ringer, CNA,  July 17, 2010 8:55 AM  Follow-up for Phone Call        Called, spoke with pt.  States he started getting a cold "sometime last week" but it worsened on Friday.  c/o cough which is productive "for a little while after using mucinex."  Mucus is tan.  Also having some wheezing and chest tightness.  Denies increased SOB and f/c/s.  using mucinex and xopenex nebs with some relief.  Pt requesting RB's recs as he was just in office on 06/27/10.  Allergies (Verified):  ! * THEODUR ! * METHOMAZOLE  [facility] [address].  Dr. Delton Coombes -- pls advise.  Thanks! Follow-up by: Gweneth Dimitri RN,  July 17, 2010 9:12 AM  Additional Follow-up for Phone Call Additional follow up Details #1::        Spoke to patient, having wheeze and now productive cough following URI. Consistent with COPD/asthma flare. Will rx pred taper and azithro. Scripts sent. He knows to call us if not getting better this week.  Additional Follow-up by: Leslye Peer MD,  July 17, 2010 10:14 AM    New/Updated Medications: PREDNISONE 10 MG TABS (PREDNISONE) 40mg  daily x 3days, 30mg  daily x 3days, 20mg  daily x 3days, 10mg  daily x3 days then stop. AZITHROMYCIN 250 MG TABS (AZITHROMYCIN) take 2 by mouth on the first day, then 1 by mouth once daily until gone. Prescriptions: AZITHROMYCIN 250 MG TABS (AZITHROMYCIN) take 2 by mouth on the first day, then 1 by mouth once daily until gone.  #6 x 0   Entered and Authorized by:   Leslye Peer MD   Signed by:   Leslye Peer MD on 07/17/2010   Method used:   Electronically to        [facility] [address] (retail)       [phone]       [place], [place]  [address]       Ph: [phone]       Fax: [phone]   RxID:   [id number] PREDNISONE 10 MG TABS (PREDNISONE) 40mg  daily x 3days, 30mg  daily x 3days, 20mg  daily x 3days, 10mg  daily x3 days then stop.  #30 x 0   Entered and Authorized by:   Leslye Peer MD   Signed by:   Leslye Peer MD on 07/17/2010   Method used:   Electronically to        [facility] [address] (retail)       [phone]       [place], [place]  [address]       Ph: [phone]       Fax: [phone]   RxID:   [id number]

## 2010-08-09 NOTE — Procedures (Signed)
Summary: Oximetry/[facility]  Oximetry/[facility]   Imported By: Sherian Rein 07/31/2010 11:26:28  _____________________________________________________________________  External Attachment:    Type:   Image     Comment:   External Document

## 2010-09-28 LAB — POCT I-STAT, CHEM 8
BUN: 11 mg/dL (ref 6–23)
Calcium, Ion: 1.22 mmol/L (ref 1.12–1.32)
Chloride: 101 mEq/L (ref 96–112)
Glucose, Bld: 135 mg/dL — ABNORMAL HIGH (ref 70–99)

## 2010-09-28 LAB — DIFFERENTIAL
Basophils Absolute: 0 10*3/uL (ref 0.0–0.1)
Basophils Absolute: 0 10*3/uL (ref 0.0–0.1)
Basophils Relative: 0 % (ref 0–1)
Basophils Relative: 0 % (ref 0–1)
Eosinophils Absolute: 0 10*3/uL (ref 0.0–0.7)
Eosinophils Absolute: 0 10*3/uL (ref 0.0–0.7)
Eosinophils Relative: 1 % (ref 0–5)
Lymphocytes Relative: 14 % (ref 12–46)
Lymphocytes Relative: 21 % (ref 12–46)
Lymphs Abs: 1 10*3/uL (ref 0.7–4.0)
Monocytes Absolute: 0.7 10*3/uL (ref 0.1–1.0)
Monocytes Relative: 14 % — ABNORMAL HIGH (ref 3–12)
Monocytes Relative: 16 % — ABNORMAL HIGH (ref 3–12)
Neutro Abs: 3.1 10*3/uL (ref 1.7–7.7)
Neutrophils Relative %: 64 % (ref 43–77)
Neutrophils Relative %: 69 % (ref 43–77)

## 2010-09-28 LAB — POCT I-STAT 3, VENOUS BLOOD GAS (G3P V)
Acid-base deficit: 2 mmol/L (ref 0.0–2.0)
Bicarbonate: 23 mEq/L (ref 20.0–24.0)
O2 Saturation: 71 %
O2 Saturation: 75 %
TCO2: 24 mmol/L (ref 0–100)

## 2010-09-28 LAB — CBC
Hemoglobin: 8.4 g/dL — ABNORMAL LOW (ref 13.0–17.0)
MCHC: 33.2 g/dL (ref 30.0–36.0)
MCV: 91.3 fL (ref 78.0–100.0)
Platelets: 58 10*3/uL — ABNORMAL LOW (ref 150–400)
RDW: 14.6 % (ref 11.5–15.5)
RDW: 14.7 % (ref 11.5–15.5)
WBC: 3.8 10*3/uL — ABNORMAL LOW (ref 4.0–10.5)

## 2010-09-28 LAB — BONE MARROW EXAM

## 2010-09-28 LAB — CHROMOSOME ANALYSIS, BONE MARROW

## 2010-09-28 LAB — POCT CARDIAC MARKERS: CKMB, poc: <1 ng/mL — ABNORMAL LOW (ref 1.0–8.0)

## 2010-09-28 LAB — PROTIME-INR: INR: 2.2 — ABNORMAL HIGH (ref 0.00–1.49)

## 2010-09-28 LAB — BRAIN NATRIURETIC PEPTIDE: Pro B Natriuretic peptide (BNP): 629 pg/mL — ABNORMAL HIGH (ref 0.0–100.0)

## 2010-10-02 LAB — BASIC METABOLIC PANEL
BUN: 13 mg/dL (ref 6–23)
BUN: 15 mg/dL (ref 6–23)
BUN: 16 mg/dL (ref 6–23)
BUN: 22 mg/dL (ref 6–23)
BUN: 25 mg/dL — ABNORMAL HIGH (ref 6–23)
CO2: 27 mEq/L (ref 19–32)
CO2: 28 mEq/L (ref 19–32)
CO2: 30 mEq/L (ref 19–32)
Calcium: 8.6 mg/dL (ref 8.4–10.5)
Calcium: 9 mg/dL (ref 8.4–10.5)
Calcium: 9.1 mg/dL (ref 8.4–10.5)
Chloride: 100 mEq/L (ref 96–112)
Chloride: 100 mEq/L (ref 96–112)
Chloride: 102 mEq/L (ref 96–112)
Chloride: 104 mEq/L (ref 96–112)
Chloride: 108 mEq/L (ref 96–112)
Creatinine, Ser: 0.51 mg/dL (ref 0.4–1.5)
Creatinine, Ser: 0.62 mg/dL (ref 0.4–1.5)
Creatinine, Ser: 0.63 mg/dL (ref 0.4–1.5)
Creatinine, Ser: 0.7 mg/dL (ref 0.4–1.5)
Creatinine, Ser: 0.77 mg/dL (ref 0.4–1.5)
GFR calc Af Amer: >60 mL/min (ref >60)
GFR calc Af Amer: >60 mL/min (ref >60)
GFR calc Af Amer: >60 mL/min (ref >60)
GFR calc Af Amer: >60 mL/min (ref >60)
GFR calc non Af Amer: >60 mL/min (ref >60)
GFR calc non Af Amer: >60 mL/min (ref >60)
GFR calc non Af Amer: >60 mL/min (ref >60)
Glucose, Bld: 106 mg/dL — ABNORMAL HIGH (ref 70–99)
Glucose, Bld: 117 mg/dL — ABNORMAL HIGH (ref 70–99)
Glucose, Bld: 161 mg/dL — ABNORMAL HIGH (ref 70–99)
Potassium: 3.6 mEq/L (ref 3.5–5.1)
Potassium: 3.7 mEq/L (ref 3.5–5.1)
Potassium: 4.1 mEq/L (ref 3.5–5.1)
Potassium: 4.2 mEq/L (ref 3.5–5.1)
Potassium: 4.8 mEq/L (ref 3.5–5.1)
Potassium: 4.8 mEq/L (ref 3.5–5.1)
Sodium: 136 mEq/L (ref 135–145)
Sodium: 136 mEq/L (ref 135–145)

## 2010-10-02 LAB — COMPREHENSIVE METABOLIC PANEL
CO2: 28 mEq/L (ref 19–32)
Calcium: 9 mg/dL (ref 8.4–10.5)
Creatinine, Ser: 0.74 mg/dL (ref 0.4–1.5)
GFR calc non Af Amer: >60 mL/min (ref >60)
Glucose, Bld: 170 mg/dL — ABNORMAL HIGH (ref 70–99)

## 2010-10-02 LAB — GLUCOSE, CAPILLARY
Glucose-Capillary: 102 mg/dL — ABNORMAL HIGH (ref 70–99)
Glucose-Capillary: 111 mg/dL — ABNORMAL HIGH (ref 70–99)
Glucose-Capillary: 112 mg/dL — ABNORMAL HIGH (ref 70–99)
Glucose-Capillary: 135 mg/dL — ABNORMAL HIGH (ref 70–99)
Glucose-Capillary: 141 mg/dL — ABNORMAL HIGH (ref 70–99)
Glucose-Capillary: 90 mg/dL (ref 70–99)

## 2010-10-02 LAB — BRAIN NATRIURETIC PEPTIDE: Pro B Natriuretic peptide (BNP): 582 pg/mL — ABNORMAL HIGH (ref 0.0–100.0)

## 2010-10-02 LAB — CBC
HCT: 30.8 % — ABNORMAL LOW (ref 39.0–52.0)
HCT: 31.6 % — ABNORMAL LOW (ref 39.0–52.0)
HCT: 32.3 % — ABNORMAL LOW (ref 39.0–52.0)
MCHC: 33.3 g/dL (ref 30.0–36.0)
MCHC: 34 g/dL (ref 30.0–36.0)
MCHC: 34.3 g/dL (ref 30.0–36.0)
MCV: 86.9 fL (ref 78.0–100.0)
MCV: 87.1 fL (ref 78.0–100.0)
MCV: 87.5 fL (ref 78.0–100.0)
MCV: 87.8 fL (ref 78.0–100.0)
MCV: 88.4 fL (ref 78.0–100.0)
Platelets: 52 10*3/uL — ABNORMAL LOW (ref 150–400)
Platelets: 54 10*3/uL — ABNORMAL LOW (ref 150–400)
Platelets: 56 10*3/uL — ABNORMAL LOW (ref 150–400)
RBC: 3.48 MIL/uL — ABNORMAL LOW (ref 4.22–5.81)
RBC: 3.54 MIL/uL — ABNORMAL LOW (ref 4.22–5.81)
RBC: 3.68 MIL/uL — ABNORMAL LOW (ref 4.22–5.81)
RDW: 15.2 % (ref 11.5–15.5)
RDW: 15.9 % — ABNORMAL HIGH (ref 11.5–15.5)
WBC: 2.5 10*3/uL — ABNORMAL LOW (ref 4.0–10.5)
WBC: 4.6 10*3/uL (ref 4.0–10.5)
WBC: 5.5 10*3/uL (ref 4.0–10.5)
WBC: 5.9 10*3/uL (ref 4.0–10.5)
WBC: 7 10*3/uL (ref 4.0–10.5)

## 2010-10-02 LAB — DIFFERENTIAL
Eosinophils Absolute: 0 10*3/uL (ref 0.0–0.7)
Lymphs Abs: 0.5 10*3/uL — ABNORMAL LOW (ref 0.7–4.0)
Monocytes Relative: 5 % (ref 3–12)
Neutrophils Relative %: 76 % (ref 43–77)

## 2010-10-02 LAB — DIC (DISSEMINATED INTRAVASCULAR COAGULATION)PANEL
D-Dimer, Quant: 0.39 ug/mL-FEU (ref 0.00–0.48)
Fibrinogen: 248 mg/dL (ref 204–475)
INR: 1.3 (ref 0.00–1.49)
Platelets: 52 10*3/uL — ABNORMAL LOW (ref 150–400)
aPTT: 42 seconds — ABNORMAL HIGH (ref 24–37)

## 2010-10-02 LAB — APTT
aPTT: 50 seconds — ABNORMAL HIGH (ref 24–37)
aPTT: 67 seconds — ABNORMAL HIGH (ref 24–37)

## 2010-10-02 LAB — T4, FREE: Free T4: 9.46 ng/dL — ABNORMAL HIGH (ref 0.89–1.80)

## 2010-10-02 LAB — HEPARIN INDUCED THROMBOCYTOPENIA PNL
Heparin Induced Plt Ab: NEGATIVE
Patient O.D.: 0.175
Serotonin Release: 3 % release (ref <20)

## 2010-10-02 LAB — T3, FREE: T3, Free: >20 pg/mL — ABNORMAL HIGH (ref 2.3–4.2)

## 2010-10-02 LAB — PROTIME-INR
INR: 1.5 (ref 0.00–1.49)
INR: 2.8 — ABNORMAL HIGH (ref 0.00–1.49)
INR: 2.9 — ABNORMAL HIGH (ref 0.00–1.49)
Prothrombin Time: 18.9 seconds — ABNORMAL HIGH (ref 11.6–15.2)
Prothrombin Time: 31.1 seconds — ABNORMAL HIGH (ref 11.6–15.2)

## 2010-10-02 LAB — LACTATE DEHYDROGENASE
LDH: 105 U/L (ref 94–250)
LDH: 110 U/L (ref 94–250)
LDH: 142 U/L (ref 94–250)

## 2010-10-02 LAB — PROTEIN ELECTROPH W RFLX QUANT IMMUNOGLOBULINS
Albumin ELP: 60.7 % (ref 55.8–66.1)
Alpha-1-Globulin: 6.5 % — ABNORMAL HIGH (ref 2.9–4.9)
Alpha-2-Globulin: 9.7 % (ref 7.1–11.8)
Beta 2: 2.8 % — ABNORMAL LOW (ref 3.2–6.5)

## 2010-10-02 LAB — FOLATE: Folate: >20 ng/mL

## 2010-10-02 LAB — IRON AND TIBC: Iron: 76 ug/dL (ref 42–135)

## 2010-10-02 LAB — SAVE SMEAR

## 2010-10-02 LAB — HEPARIN LEVEL (UNFRACTIONATED): Heparin Unfractionated: 0.18 IU/mL — ABNORMAL LOW (ref 0.30–0.70)

## 2010-10-02 LAB — DIRECT ANTIGLOBULIN TEST (NOT AT ARMC)
DAT, IgG: NEGATIVE
DAT, complement: NEGATIVE

## 2010-10-02 LAB — FERRITIN: Ferritin: 168 ng/mL (ref 22–322)

## 2010-10-31 NOTE — Assessment & Plan Note (Signed)
[facility]                            CARDIOLOGY OFFICE NOTE   YERACHMIEL, SPINNEY                       MRN:          [id number]  DATE:08/05/2008                            DOB:          01-11-1956    PRIMARY CARE PHYSICIAN:  Fleet Contras, MD   ENDOCRINOLOGIST:  Dorisann Frames, MD   INTERVAL HISTORY:  [name] is a very pleasant 55 year old male with a  history of hyperthyroidism, secondary to Graves disease, status post I-  131 ablation in December 2009.  This has been complicated by atrial  fibrillation/flutter with rapid ventricular response, requiring Tikosyn  therapy.  He has also had significant volume overload.  Remainder of his  past medical history is notable for asthma and COPD.   He returns today for followup.  We saw him last month, at which time he  had significant edema.  We increased his Lasix and also put him on  compression hose.  He says this has helped some of his edema, but he  still gets a bit.  He continues to have occasional palpitations, but  says these are very short-lived that happened several times a day.  Unfortunately, his exercise capacity really has not picked it.  He still  feels quite weak.  He did see physical therapy and got some exercises,  but is just doing them at home now.  His other major complaint is that  he is having a very hard time sleeping.  He gets up in the middle of the  night and then jus can't go back to bed and he tosses and turns.   CURRENT MEDICATIONS:  1. Tikosyn 500 mcg b.i.d.  2. Protonix 40 a day.  3. Coumadin.  4. Lasix 80 b.i.d.  5. Propranolol 160 b.i.d.  6. Potassium 40 in the morning and 20 at night.  7. Xopenex and Atrovent nebulizers b.i.d.   PHYSICAL EXAMINATION:  GENERAL:  He is somewhat frail-appearing male in  no acute distress.  He ambulates around the clinic without respiratory  difficulty.  VITAL SIGNS:  Blood pressure is 110/50, heart rate is 86,  and weight is 141.  HEENT:  Normal except for some temporal wasting.  NECK:  Supple.  JVP is about 9-cm of water with prominent V-waves.  There is no lymphadenopathy.  He does have a goiter.  This is somewhat  decreased in size.  CARDIAC:  He is hyperdynamic.  PMI is nondisplaced.  He is regular with  a 2/6 systolic ejection murmur at the left sternal border and a soft  apical murmur.  LUNGS:  Clear.  ABDOMEN:  Soft, nontender, and nondistended.  No hepatosplenomegaly.  No  bruits.  No masses.  Aortic impulse is very prominent.  EXTREMITIES:  Warm with no cyanosis or clubbing.  He has compression  hose on.  There is 1+ edema bilaterally.  NEUROLOGIC:  Alert and oriented x3.  Cranial nerves II through XII are  intact.  Moves all 4 extremities without difficulty.  Affect is  pleasant.   EKG shows sinus rhythm with left atrial enlargement at a rate of 74.  ASSESSMENT AND PLAN:  1. Congestive heart failure in the setting of significant valvular      disease with moderate mitral regurgitation and tricuspid      regurgitation as well as hyperthyroidism.  His volume status is      improved.  I think we need to repeat his echo and now that he is      back in sinus rhythm and reassess his valvular disease.  We will      keep his diuretics where they are.  2. Hyperthyroidism.  This is followed by Dr. Talmage Nap.  We will get a      thyroid panel today as he is not scheduled to see her until April.  3. Fatigue.  I was hoping that he would be improving steadily, but he      seem to have plateaued.  I am not sure if this is related to his      thyroid or to his heart disease.  Once again, we are rechecking a      thyroid panel today.  Also, repeat his echocardiogram.  4. Atrial fibrillation.  This is mostly controlled on Tikosyn, though      he does seem to have some brief breakthrough throughout the day.      Unfortunately, he is not a good candidate for any other therapies.      We did consider a Multaq, but given  the results of the patient in      heart failure I am little bit skeptical.  5. Insomnia.  We will try him on some Ambien and see if this helps.  6. 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  However, it seems to have persisted.  He does have a      history of alcohol and drug use and I am wondering if he may have      some intrinsic liver disease or splenic sequestration.  He is due      for followup with Dr. Clelia Croft in the near future.     Bevelyn Buckles. Bensimhon, MD  Electronically Signed    DRB/MedQ  DD: 08/05/2008  DT: 08/06/2008  Job #: [id number]   cc:   Dorisann Frames, M.D.

## 2010-10-31 NOTE — Op Note (Signed)
NAMETIMOTEO, CARREIRO              ACCOUNT NO.:  [id number]   MEDICAL RECORD NO.:  [id number]          PATIENT TYPE:  EMS   LOCATION:  ED                           FACILITY:  [facility]   PHYSICIAN:  Firas N. 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   DESCRIPTION OF PROCEDURE:  [name] was brought into [facility] Stay and after obtaining informed consent, a time-out was taken to  identify the patient and the procedure.  The patient's Mallampati  classification was identified as a class II and his American Society of  Anesthesiology classification was class 2.  The patient was placed in  the decubitus position, exposing the left iliac crest.  The skin was  prepped and draped in a sterile fashion using Betadine and sterile  drapes.  Conscious sedation was obtained after 4 mg of Versed and 50 mg  of Demerol.  The skin, periosteum, and subcutaneous tissue were  anesthetized.  Using the Jamshidi needle, an aspirate was obtained after  multiple attempts.  A biopsy was obtained after 2 attempts and that was  done successfully.  The patient tolerated the procedure well.  There was  no bleeding.  Pressure was held on the incision that was made by an 11-  blade scalpel and upon completion of the procedure, there was no  evidence of bleeding or oozing.  The patient tolerated the procedure  well.  His heart rate had ranged between 73 sinus to spurts of atrial  fibrillation around 110 that were nonsustained.  The patient was  instructed to lay flat for the next 45 minutes.  The results will be  discussed at his next office visit.           ______________________________  [name]. [facility]  Electronically Signed     FNS/MEDQ  D:  08/31/2008  T:  08/31/2008  Job:  [id number]   cc:   Norman Buckles. Bensimhon, MD  [address] [place] [address]

## 2010-10-31 NOTE — Consult Note (Signed)
[name], [name]              ACCOUNT NO.:  [id number]   MEDICAL RECORD NO.:  [id number]          PATIENT TYPE:  INP   LOCATION:  2907                         FACILITY:  [facility]   PHYSICIAN:  Blenda Nicely. Shadad        DATE OF BIRTH:  May 22, 1956   DATE OF CONSULTATION:  06/25/2008  DATE OF DISCHARGE:                                 CONSULTATION   REASON FOR CONSULTATION:  Pancytopenia.   HISTORY OF PRESENT ILLNESS:  [name] is a pleasant 55 year old man  admitted on June 24, 2008 with high output heart failure and atrial  fibrillation in the setting of thyroid toxicosis to see for evaluation  of pancytopenia.  In review, the patient had no history of cardiac  disease until he underwent radioactive iodine treatment on June 09, 2008.  Of note, the patient had previously been treated with  methimazole, but discontinued due to neutropenia.  He is not a candidate  for PTU on the methimazole.  Platelets back in December 2008, which are  the earliest labs available were about 111,000.  As of August 2009, this  dropped to 81K, and as of 06/08/2008, 2 months after methimazole and 2  days after discontinuing radioactive iodine, these platelets were about  62,000.  On admission, his platelets dropped to 47,000.  Today they are  49K.  He is anemic as well, with a hemoglobin and hematocrit ranging  from 10 and 32, respectively.  His white count was in the range of 3000  to 5000 until December 2009.  Today the white count is 2.5, with an ANC  of 1.9.  Lymphocytes 0.5 and monocytes 0.1.  He is not acutely bleeding.  We were asked to see him, for evaluation of pancytopenia.  Of note, he  is on heparin, due to his atrial fibrillation, for which he is to  undergo cardioversion today.   PAST MEDICAL HISTORY:  1. CHF, ejection fraction of 55-60%.  2. Atrial fibrillation.  3. Hyperthyroidism, treated with radioactive iodine in December 2009,      as above.  4. Asthma as a child with  recurrence now.  5. Remote tobacco quitting in 1990.  6. Remote history of drug abuse until 1989.  7. Possible chronic cholecystitis, for surgery at one point, as seen      per hepatobiliary scan.  The patient has known benign hepatic      hemangioma since 2005, as per the MRI.  8. Prior history of shingles.   SURGERIES:  None.   ALLERGIES:  THEOPHYLLINE CAUSES RASH, AND THE PATIENT IS INTOLERANT TO  PTU AND METHIMAZOLE.   MEDICATIONS:  1. Lanoxin 0.25 mg daily.  2. Protonix 40 mg daily.  3. Lasix 40 mg q.8 h.  4. K-Dur 20 mEq t.i.d.  5. Heparin per pharmacy.  6. Decadron 2 mg IM q.8 h x4, to start prednisone on June 26, 2008.  7. The patient was on Lovenox 40 mg daily, discontinued today.  8. P.r.n. Brevibloc, Xopenex, Atrovent, Valium.   REVIEW OF SYSTEMS:  Remarkable for dyspnea on exertion, abdominal pain,  which  is somewhat chronic.  In December, it was significant.  This is  much improved now.  He has a 53-pound weight loss over the last couple  of years.  He has increasing fatigue, and lower extremity edema.  Rest  of the review of systems is negative.   FAMILY HISTORY:  Mother alive and well.  Father died of unknown causes.  He has two siblings with asthma.   SOCIAL HISTORY:  The patient is married.  He has no tobacco use at this  time, smoked until 1990.  He also used drugs until 1989.  The patient  now works as a Pensions consultant.  Lives in [place].  He is  __________.   PHYSICAL EXAMINATION:  GENERAL:  This is a very thin, ill-appearing, 89-  year-old [place] American male in no acute distress, alert and oriented  x3.  VITAL SIGNS:  Blood pressure 130/71, pulse 133, respirations 25,  temperature 98.1, pulse oximetry 97% on room air.  HEENT:  Remarkable for temporal wasting, no significant exophthalmos, no  icteric sclera.  Tongue smooth.  No lesions in his oral mucosa.  No  thrush.  NECK:  Remarkable for enlarged goiter, firm, no other cervical  or  supraclavicular masses.  LUNGS:  Clear to auscultation bilaterally.  No axillary masses.  CARDIOVASCULAR:  Increased regular rate and rhythm without murmurs, rubs  or gallops.  ABDOMEN:  Soft, slightly tender in the right upper quadrant, trace edema  in both legs, no inguinal masses.  SKIN:  Without bruising or petechial rash.  There is no open lesions.  GU and RECTAL:  Deferred.  MUSCULOSKELETAL:  No spinal tenderness.  NEUROLOGICAL:  Nonfocal.   LABORATORY DATA:  Hemoglobin 10.3, hematocrit 3.2, white count 2.5,  platelets 49, MCV 86.9, AST 1.9, monocytes 0.1, lymphocytes 0.5.  Sodium  143, potassium 4.2, BUN 13, creatinine 0.63, glucose 117, total  bilirubin 2.4, alkaline phosphatase 115, AST 32, ALT 3.2, total protein  5.6, albumin 3.3, calcium 9.2, TSH 0.02.  HIV is pending.  BNP 700.  CT  angio of the chest on June 24, 2008, shows no acute abnormalities, no  PE, diffuse enlargement of thyroid, 2.6 cm hemangioma at T12 without  collapse   ASSESSMENT/PLAN:  Dr. Clelia Croft has seen and evaluated the patient and  reviewed the chart.  This is a 55 year old male with known history of  hyperthyroidism for the last 2 years, he has undergone treatment with  methimazole, unclear for how much at this time but was discontinued  secondary to neutropenia and thrombocytopenia.  He is status post I-131  treatment on June 07, 2008.  The patient was admitted with atrial  fibrillation and RVR and high output heart failure.  The patient was  found to have worsening symptoms.  He is status post CT angio, which is  unrevealing.  His exam is remarkable for the obvious goiter, and  ecchymosis on the left elbow.  Review of the smear shows large  platelets, no schistocytes, no dysmorphic white blood cells.  There is a  varying in size and shape of the RBCs with bur cells and spur cells and  very few with polychromasia.   IMPRESSION:  1. 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  2. High output  heart failure secondary to atrial fibrillation and RPR.  3. Mild anemia, leukopenia and thrombocytopenia.   Differential diagnosis are most likely secondary to medications, such as  methimazole causing bone marrow suppression.  As well, autoimmune  process in the setting  of Graves disease could cause worsening  thrombocytopenia and anemia.  DIC, TTP, and HIT are likely, but they  need to be ruled out.   RECOMMENDATIONS:  Check Coombs, ANA, LDH, DIC, and HIT panel.  No need  for transfusion.  Check the counts daily.  No problems with using  heparin at this time, until further recommendations.  Will follow with  you.  Thank you very much for allowing [place] the opportunity to participate  in the care of this patient.      Marlowe Kays, P.A.      Blenda Nicely. [facility]  Electronically Signed    SW/MEDQ  D:  06/29/2008  T:  06/29/2008  Job:  [id number]   cc:   Dorisann Frames, M.D.

## 2010-10-31 NOTE — Assessment & Plan Note (Signed)
[facility]                            CARDIOLOGY OFFICE NOTE   [name], KONDRACKI                       MRN:          [id number]  DATE:07/12/2008                            DOB:          19-Nov-1955    PRIMARY CARE PHYSICIAN:  [name], M.D.   ENDOCRINOLOGIST:  [name], M.D.   INTERVAL HISTORY:  [name] is a very pleasant 55 year old male with a  history of hyperthyroidism secondary to Graves disease status post I-131  ablation in December 2009.  He was admitted to hospital earlier this  month with high-output heart failure in the setting of atrial  fibrillation with rapid ventricular response and thyrotoxicosis.  He was  significantly volume overloaded.  He was cardioverted and started on  Tikosyn.   I saw him last week and he was still having some trouble, although he  was a little bit better.  He had moderate amount of volume overload and  shortness of breath.  We increased his Lasix and brought him back today  for a followup.   He still feels quite weak and gets short of breath just with mild  activity.  He has been using his nebulizer, but this really does not  help that much.  He does note mild lower extremity edema.  No fevers or  chills.  He does have some palpitations, but these are usually short-  lived.  His longest episode was about 20 minutes.  He is having problems  and sleeping also feels that his balance is worse than it was  previously.  He has not had any other focal neurologic symptoms.   CURRENT MEDICATIONS:  1. Lasix 80 in the morning, 40 at night.  2. Potassium 40 in the morning and 20 at night.  3. Propranolol 120 b.i.d.  4. Tikosyn 500 b.i.d.  5. Protonix 40 a day.  6. Coumadin.  7. Prednisone 40 a day.   PHYSICAL EXAMINATION:  GENERAL:  He is a thin male, in no acute distress  ambulatory in the clinic with mild dyspnea.  VITAL SIGNS:  His saturations stay at 96%.  Blood pressure is 122/52,  heart rate  74, and weight is 143, which is up 1 pound.  HEENT:  Normal  except for some temporal wasting.  NECK:  Supple.  JVP is about 6 cm of water.  There is no  lymphadenopathy.  His goiter is decreased in size.  There is bilateral  bruits.  CARDIAC:  He is hyperdynamic.  PMI is nondisplaced.  There is 2/6  systolic ejection murmur at the left sternal border.  LUNGS:  Clear.  ABDOMEN:  Soft, nontender, and nondistended.  No hepatosplenomegaly, no  bruits, and no masses.  EXTREMITIES:  Warm with no cyanosis or clubbing.  There is 1+ edema on  the midcalf down, this is slightly improved from before.  There is no  rash.  NEUROLOGIC:  Alert and oriented x3.  Cranial nerves II through XII are  grossly intact.  His finger-to-nose examination is intact.  His rapid  alternating movements were okay.  There is no pronator drift.  His  strength appears normal throughout.  Affect is pleasant.   EKG shows sinus rhythm.  On reviewing his monitor that he has been  wearing he has had some brief runs of SVT and also a brief run of  nonsustained VT.  I do not see any obvious atrial fibrillation.   ASSESSMENT AND PLAN:  1. Congestive heart failure secondary to diastolic dysfunction and      thyrotoxicosis.  Volume overload is somewhat improved.  We will      continue his Lasix at his current dose.  We will check a repeat      labs today.  2. Atrial fibrillation/flutter.  Continue Tikosyn, this has been      fairly well controlled though he has had some breakthrough.  3. Hypothyroidism.  He is status post I-131 ablation.  I will have him      follow up with Dr. Talmage [name] next.  4. Weakness and insomnia.  I wonder if this is related to the      prednisone.  He will follow up with by Dr. Talmage [name] tomorrow to      further evaluate.  We will also get a physical therapy evaluation      on him to see if he can benefit from any other services.     Norman Buckles. Bensimhon, MD  Electronically Signed    DRB/MedQ  DD:  07/12/2008  DT: 07/13/2008  Job #: [id number]   cc:   [name], M.D.  [name], M.D.

## 2010-10-31 NOTE — Cardiovascular Report (Signed)
[name], [name]              ACCOUNT NO.:  [id number]   MEDICAL RECORD NO.:  [id number]          PATIENT TYPE:  OIB   LOCATION:  1965                         FACILITY:  [facility]   PHYSICIAN:  Bevelyn Buckles. Bensimhon, MDDATE OF BIRTH:  December 23, 1955   DATE OF PROCEDURE:  08/23/2008  DATE OF DISCHARGE:  08/23/2008                            CARDIAC CATHETERIZATION   PATIENT IDENTIFICATION:  [name] is a very pleasant 55 year old  male with a history of asthma.  He has recently been treated for Graves  disease with hyperthyroidism.  He was admitted with high-output heart  failure.  Echocardiogram showed a dilated RV and moderate mitral  regurgitation with normal LV function.  We have diuresed him  aggressively; however, he remains considerably short of breath.  We  brought him back today for right heart catheterization to further  evaluate.   PROCEDURE PERFORMED:  Right heart cath.   DESCRIPTION OF THE PROCEDURE:  The risk and indication were explained.  Consent was signed and placed on a chart.  A 7-French venous sheath was  placed in the right femoral vein.  Standard right heart cath was  performed with a Swan-Ganz catheter.  There were no apparent  complications.  Right atrial pressure mean of 19, RV pressure 73/7 with  an EDP of 20, PA pressure 70/22 with a mean of 43, pulmonary capillary  wedge pressure mean of 23 with a V-wave to 38.  Fick cardiac output was  6.0 with an index of 3.4.  Pulmonary vascular resistance is 3.3 Woods  units.   ASSESSMENT:  1. Moderate pulmonary hypertension with pulmonary venous and pulmonary      arterial components.  2. Moderately elevated left ventricular pressures.  3. Significant mitral regurgitation.   PLAN/DISCUSSION:  His pulmonary hypertension seems multifactorial.  We  will increase Lasix to 120 mg in the morning and 80 mg in the afternoon  to help with his left-sided filling pressures.  We will check pulmonary  function test and  consider a possible pulmonary evaluation.  I would  hold on pulmonary vasodilators at this point.  At this point, vascular  resistance is only mildly elevated.      Bevelyn Buckles. Bensimhon, MD  Electronically Signed     DRB/MEDQ  D:  08/23/2008  T:  08/23/2008  Job:  [id number]

## 2010-10-31 NOTE — Assessment & Plan Note (Signed)
[facility]                            CARDIOLOGY OFFICE NOTE   [name], [name]                       MRN:          [id number]  DATE:07/23/2008                            DOB:          [birth year]    INTERVAL HISTORY:  [name] is a very pleasant 55 year old male with a  history of hyperthyroidism secondary to Graves disease, status post I-  131 ablation in 2009.  His course has been complicated by a thyroid  storm.  He was admitted in January for high output heart failure and  persistent atrial fibrillation.  He underwent cardioversion and was  started on Tikosyn.  We have been following him closely and working with  him on his diuresis.   He presents to the clinic today for an unscheduled visit.  He was seen  in the Coumadin Clinic and was complaining of some chest pain.  He tells  me that about twice a day, he feels his heart racing and last about 5-10  minutes and it goes away.  He has not had anything longer than this.  This is occasionally associated with some chest tightness.  He has not  had typical anginal symptoms.  He is able to walk around the house and  do all his activities without much difficulty.  However, when he has to  go up steps or anything more vigorous he does get short of breath.  His  wife is concerned that his edema is not responding as well as she  thought it would.  He denies any orthopnea or PND.  He has not had any  recent fevers or chills appeared.  He has been very compliant with his  medicines and doing his best to be compliant with his fluid restriction,  though he says it is difficult.   He saw Dr. Talmage [name] in the Endocrinology Clinic about a week ago, who began  to taper his dose of steroids.  Unfortunately, he is not a candidate for  Tapazole or PTU due to previous leukopenia with these medicines.   CURRENT MEDICATIONS:  1. Lasix 80 mg in the morning and 40 at night.  2. Potassium 40 in the morning and 20 at  night.  3. Prednisone taper.  4. Propranolol 120 b.i.d.  5. Tikosyn 500 mcg b.i.d.  6. Protonix 40 a day.  7. Coumadin.  8. Symbicort inhaler, which is currently on hold.   PHYSICAL EXAMINATION:  GENERAL:  He ambulates in the clinic without  respiratory difficulty.  VITAL SIGNS:  Blood pressure is 110/60, heart rate is 139 and irregular.  HEENT:  Normal.  He still has some mild temporal wasting, but this is  improving.  NECK:  Supple.  There has been a marked decrease in the size of his  goiter.  JVP is about 8-9 cm of water with prominent CV waves.  There  are bruits over both sides of his goiter.  No lymphadenopathy  appreciated.  CARDIAC:  PMI is nondisplaced.  He is hyperdynamic.  He is irregularly  irregular and tachycardic.  No obvious murmur, although it is hard to  hear due to his tachycardia.  LUNGS:  Clear.  ABDOMEN:  Soft and nontender.  His liver edge is down about for 4-5 cm  in the midclavicular line and pulsatile.  He has normoactive bowel  sounds.  EXTREMITIES:  Warm.  No cyanosis or clubbing.  There is 2-3+ edema  bilaterally.  No rash.  NEUROLOGIC:  Alert and oriented x3.  Cranial nerves II through XII are  grossly intact.  Moves all 4 extremities without difficulty.  Affect is  very pleasant.   EKG shows atrial fibrillation at a rate with ventricular response of 139  and nonspecific ST abnormalities.  Previous Holter monitor from January  shows predominantly sinus rhythm with a mean ventricular response of 82.  There are no significant AFib.  He does have some brief runs of SVT and  nonsustained VT, but nothing more than 10 or 15 beats.   ASSESSMENT AND PLAN:  1. Chest pain.  I think this is likely related to his atrial      fibrillation and not underlying ischemic heart disease.  At all      cause, I would like to avoid cardiac catheterization now as I      cannot want to give him an iodine load in the setting of a recent      iodine ablation and perhaps a  need for a second procedure down the      road.  We will continue to manage medically.  2. Atrial fibrillation.  From symptomatically, I suspect he is having      minor episodes of breakthrough.  Almost again, he is not a      candidate for amiodarone due to the iodine.  I think Tikosyn is our      best option at this point.  It really has done a lot to decrease      his atrial fibrillation burden.  We will increase his propranolol      to 160 b.i.d. and continue his Coumadin.  We will put a Holter      monitor back on him just once again to evaluate his AFib burden.  3. Diastolic heart failure, secondary to his hyperthyroidism.  We will      increase his Lasix to 80 b.i.d.  Put compression hose on him nd      recheck BMET in 1 week.  He will return to see [place] next week for      followup on his volume status.   DISPOSITION:  As above, see him back next week.     [name]. Bensimhon, MD  Electronically Signed    DRB/MedQ  DD: 07/23/2008  DT: 07/24/2008  Job #: [id number]

## 2010-10-31 NOTE — Consult Note (Signed)
[facility]                          ENDOCRINOLOGY GOMER, [place]                       MRN:          [id number]  DATE:01/23/2007                            DOB:          1956/02/10    REFERRING PHYSICIAN:  Fleet Contras, M.D.   REASON FOR REFERRAL:  Hyperthyroidism.   HISTORY OF PRESENT ILLNESS:  A 55 year old man who reports a 30-pound  weight loss over the past 3 months.  He has moderate associated  palpitations in the chest and slight tremor of his hands.  He also has  some muscle weakness and some shortness of breath.  He feels that the  shortness of breath is contributed to by his asthma, and has not changed  much recently.   PAST MEDICAL HISTORY:  Negative other than the asthma.   SOCIAL HISTORY:  He is married.  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   FAMILY HISTORY:  Negative for thyroid disease.   REVIEW OF SYSTEMS:  Denies fever, nausea, vomiting, and anxiety.   PHYSICAL EXAMINATION:  VITAL SIGNS:  Blood pressure 132/78, heart rate  98, temperature is 98.7, the weight is 164.  GENERAL:  No distress.  SKIN:  Not diaphoretic.  HEENT:  No proptosis.  No periorbital swelling.  NECK:  The thyroid is slightly enlarged with an irregular surface.  There is no discrete nodule.  CHEST:  Clear to auscultation.  No respiratory distress.  CARDIOVASCULAR:  No edema.  Tachycardic.  Regular rhythm.  No murmur.  MUSCULOSKELETAL:  Muscle strength and bulk are normal in the lower  extremities.  NEUROLOGIC:  Alert, oriented, does not appear anxious nor depressed and  has no tremor.   LABORATORY STUDIES:  Forwarded by Dr. Concepcion Elk, on Nov 04, 2006, TSH  0.015.   IMPRESSION:  1. Hyperthyroidism of uncertain etiology.  2. Shortness of breath is probably contributed to by his asthma as      well as the recent hot weather.  3. He has lost quite a bit of weight.  Despite the great amount of      weight loss, it  probably is all due to his hyperthyroidism.   PLAN:  We discussed the differential diagnosis, natural history, risks  and treatment options for hyperthyroidism.  He will have a thyroid  nuclear medicine scan and I will inform him by telephone of the results.     Sean A. Everardo All, MD  Electronically Signed    SAE/MedQ  DD: 01/24/2007  DT: 01/24/2007  Job #: [id number]   cc:   Fleet Contras, M.D.

## 2010-10-31 NOTE — Assessment & Plan Note (Signed)
[facility]                            CARDIOLOGY OFFICE NOTE   [name], [name]                       MRN:          [id number]  DATE:07/06/2008                            DOB:          03/14/1956    PRIMARY CARE PHYSICIAN:  Fleet Contras, MD   ENDOCRINOLOGIST:  Dorisann Frames, MD   INTERVAL HISTORY:  [name] is a very pleasant 55 year old male with  a history of hyperthyroidism secondary to Graves' disease status post  I31 ablation in December 2009.  We admitted him to the hospital last  week for high output heart failure in the setting of atrial fibrillation  and flutter with rapid ventricular response.  He was significantly  volume overloaded.  He failed rate control, as well as electrical  cardioversion.  He is eventually started on Tikosyn.  He did have some  mild breakthrough atrial fibrillation, but it was much improved.  He  also has a history of underlying asthma and COPD.   He returns today for routine followup.  Over the past few days, he has  noted that he gets a little bit more short of breath with activity,  although this goes away once he sits down.  He also notes that when he  lays down at night.  He gets more short of breath, but improved with  sitting up.  He has been drinking about 16 ounce glasses of water a day.  He has been compliant with all his medications.  He has not noticed  significant lower extremity swelling.  He does note that at times  throughout the day that his heartbeat feels more prominent and this  seems to correlate more with his dyspnea.  He has not had any bleeding  with his Coumadin.   CURRENT MEDICATIONS:  1. Prednisone 40 mg a day.  2. Lasix 40 b.i.d.  3. Potassium 20 b.i.d.  4. Propranolol 120 b.i.d.  5. Tikosyn 500 b.i.d.  6. Protonix 40 a day.  7. Coumadin.   PHYSICAL EXAMINATION:  GENERAL:  He is a thin male in no acute distress,  ambulates in the clinic with mild dyspnea.  Blood  pressure is 116/62,  heart rate 74 weight is 142; weight at discharge was 61 kg, up from when  he was in the hospital, which is 134 pounds.  His weight is up about 8  pounds.  HEENT:  Normal except for some temporal wasting.  NECK:  Supple.  JVP is about to 8-9 cm of water with prominent CV waves.  There is no lymphadenopathy.  His goiter is much decreased in size.  CARDIAC:  He is hyperdynamic.  PMI is nondisplaced as he is regular.  There is 2/6 systolic ejection murmur at the left sternal border.  LUNGS:  Clear.  ABDOMEN:  Soft, nontender, nondistended.  No hepatosplenomegaly, no  bruits, no masses.  EXTREMITIES:  Warm with no cyanosis or clubbing.  There is 2+ edema from  the calf down.  No rash.  NEURO:  Alert and oriented x3.  Cranial nerves II through XII are  intact.  Moves all  4 extremities without difficulty.  Affect is  pleasant.   EKG shows sinus rhythm at a rate of 74.  There is left atrial  enlargement.  QTc is 421 milliseconds.   ASSESSMENT AND PLAN:  1. Congestive heart failure secondary to diastolic dysfunction and      thyrotoxicosis.  He does have some mild volume overload today with      New York Heart Association class III symptoms.  We will increase      his Lasix to 80 in the morning and 40 at night.  We will also      increase his potassium.  He will cut back on his fluid intake.  We      will check electrolytes today in next week.  We will watch him very      closely.  2. Atrial fibrillation/flutter.  It appears that he may be having some      more breakthrough, we will put a 40-hour monitor on him, continue      Coumadin for now.  3. Hyperthyroidism.  He is status post I-131 ablation.  His goiter is      much small.  He will continue on prednisone.  He will see Dr. Talmage Nap      in a couple of weeks.   DISPOSITION:  I will see him next week for followup.  He knows to  contact me sooner if he is having problems.     Bevelyn Buckles. Bensimhon, MD   Electronically Signed    DRB/MedQ  DD: 07/06/2008  DT: 07/07/2008  Job #: [id number]   cc:   Dorisann Frames, M.D.  Fleet Contras, M.D.

## 2010-10-31 NOTE — H&P (Signed)
[name], [name]              ACCOUNT NO.:  [id number]   MEDICAL RECORD NO.:  [id number]          PATIENT TYPE:  EMS   LOCATION:  MAJO                         FACILITY:  [facility]   PHYSICIAN:  Sandria Bales. Ezzard Standing, M.D.  DATE OF BIRTH:  10-14-1955   DATE OF ADMISSION:  06/09/2008  DATE OF DISCHARGE:                              HISTORY & PHYSICAL   Date of consultation?   REASON FOR CONSULTATION:  Gall bladder disease   REQUESTING PHYSICIAN:  Donnetta Hutching   HISTORY OF ILLNESS:  This is a 55 year old black male who is a patient  of Dr. Fleet Contras of [facility] and sees Dr. Dorisann Frames for  hyperthyroidism.   He was treated with radioactive iodine on Monday, 21 December, developed  some shortness of breath.  I have talked to the doctors who gave the  radioactive iodine 131, and  I do not think he talked to either Dr.  Concepcion Elk or Dr. Talmage Nap and was told to come to the emergency room and was  treated for his shortness of breath.   The treatment seemed to improve his breathing, but he was found to have  some right upper quadrant abdominal pain and an ultrasound was done,  which shows a thickened gallbladder wall, but no obvious gallstones,  suggestive of possible cholecystitis.   When I actually talked to the patient, he has had some vague abdominal  pain going on at least 3 weeks.  He saw Dr. Concepcion Elk who thought this may  be a muscle pull and gave him some Vicodin with some Flexeril, which  seemed to help some of his symptoms.   He denies any known history of peptic ulcer disease, liver disease, and  pancreas disease.  He has had no abdominal surgery.  He has never had a  prior gallbladder surgery.  He did get a CT scan about a year ago in  December 2008 for abdominal pain concerned for an aortic aneurysm, but  at that time his CT scan of the abdomen was negative.   He did have a colonoscopy 3 years ago, he could not remember the name of  the doctor who did the  colonoscopy.   ALLERGIES:  He is allergic to THEOPHYLLINE, which gives him a rash.   CURRENT MEDICATIONS:  1. Symbicort he is taking for asthma.  He had adult asthma since the      early adulthood.  2. He has been on atenolol, this I think is to help control his heart      rate and his hyperthyroidism.  3. He is on tapering dose of prednisone, which was given and      anticipation was radioactive iodine.  4. He takes an aspirin a day.   REVIEW OF SYSTEMS:  NEUROLOGIC:  He has had no history of seizure or  loss of conscious.  He does have a prior history of drug abuse, but he  states his last drugs were in 1987.  PULMONARY:  He also was a smoker 1 time, but quit this over 20 years  ago, but he does have adult asthma,  again for which he takes Symbicort  and it seemed like he had an exacerbation today.  CARDIAC:  He has been  tachycardic, which will attribute to hyperthyroidism.  Again, this has  been treated by Dr. Talmage Nap.  I think, he is also seeing Dr. Lucianne Muss in the  past.  GASTROINTESTINAL:  See history of present illness.  UROLOGIC:  No history kidney stones or kidney infections.  ENDOCRINE:  He has been diagnosed with hyperthyroidism, and has had this  diagnosis for at least 2 years.  He first did not want to have  radioactive iodine, but he finally got RAI at [facility] 2 days ago.  It was the nuclear medicine department he consulted with his SOB and  they advised that he go to the ER.  He also had methimazole (he thinks  that is the name of it) about 2 or 3 months ago, developed some  depressed blood counts secondary to the methimazole.  I do not have any  of these records.  He thinks that his labs have improved.  But he is  thrombocytopenic.   His wife is at his bedside when I talked to him.  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   PHYSICAL EXAMINATION:  VITAL SIGNS:  His temperature is 98.4, pulse is  101, respirations 22, and sats  were 96%.  GENERAL:  He is a well-nourished thin black male.  Alert and cooperative  on physical exam.  HEENT:  Unremarkable.  NECK:  Supple without masses or thyromegaly.  LUNGS:  Clear to auscultation.  HEART:  Tachycardic.  He is fairly hyperdynamic.  He is thin enough that  he can actually feel his heart pulsation through his chest and upper  abdomen.  ABDOMEN:  He has an upper abdominal scar, I assume when he fell on glass  jar as a 55-year old.  He has a little soreness in the right upper  quadrant, in his left lower quadrant, but I do not feel any mass or  tenderness without any guarding or rebound.  EXTREMITIES:  Strength testing to all 4 extremities.  NEUROLOGIC:  Grossly intact.   LABORATORY DATA:  Labs I have show a sodium of 140, potassium 3.9,  chloride of 107, CO2 of 27, glucose of 116, creatinine of 0.57.  His  albumin is 3.2, and his lipase is 22.  His urinalysis was negative.  He  did have a urine drug screen, which was negative.  His white blood count  was 4400 with 87% neutrophils, his hemoglobin is 10.9, and his platelet  count is only 62,000.   I reviewed his ultrasound with Waldemar Dickens; it does show a thickened  gallbladder wall, but there were no obvious stones.   IMPRESSION:  1. Thickened gallbladder wall ultrasound, although he has had symptoms      for over 3 weeks.  It is unclear whether this is more acute or      chronic and whether it is really gallbladder disease.  Also, it      will be best for him by having an outpatient hepatobiliary scan to      document his gallbladder disease better if he really has      gallbladder disease and he will be set up for likely surgery.  Of      note, he has some significant comorbid conditions that need to be      addressed before undergoing general anesthesia and  surgery.  These      include the status of his hyperthyroidism, which has been treated      with the radioactive iodine and the thrombocytopenia, which he  may      need some further evaluation but it had been attributed to      methimazole.  I told him to contact Dr. Concepcion Elk and Talmage Nap regarding      this.  2. Hyperthyroidism treated just 2 days ago without radioactive iodine.  3. Asthma, which he had upon acute exacerbation, which had been      treated today.  4. Hyperdynamic/tachycardia presumably secondary to his      hyperthyroidism.  5. He has thrombocytopenia and anemia, which had been blamed on      methimazole, but he took that some 2      months ago, but this needs to be addressed before any elected      surgery.  6. Remote history of drug abuse.  7. On steroid dose pack for RAI.      Sandria Bales. Ezzard Standing, M.D.  Electronically Signed     DHN/MEDQ  D:  06/09/2008  T:  06/10/2008  Job:  [id number]   cc:   Fleet Contras, M.D.  Dorisann Frames, M.D.

## 2010-10-31 NOTE — H&P (Signed)
NAMEPOLK, MINOR              ACCOUNT NO.:  [id number]   MEDICAL RECORD NO.:  [id number]          PATIENT TYPE:  INP   LOCATION:  NA                           FACILITY:  [facility]   PHYSICIAN:  Bevelyn Buckles. Bensimhon, MDDATE OF BIRTH:  [birth year]   DATE OF ADMISSION:  06/24/2008  DATE OF DISCHARGE:                              HISTORY & PHYSICAL   PRIMARY CARE PHYSICIAN:  Dr. Fleet Contras.   ENDOCRINOLOGIST:  Dr. Dorisann Frames.   REASON FOR ADMISSION:  High output heart failure and atrial fibrillation  in the setting of thyroid toxicosis.   HISTORY OF PRESENT ILLNESS:  Mr. Stcharles is a very pleasant 55 year old  man who is the husband of Almira Coaster in our office.  He has a history of  asthma but no known cardiac history.  Apparently for the past 2 years he  has had hyperthyroidism related to Graves disease.  He has avoided  treatment for this for some time.  He finally underwent radioactive  iodine treatment on December 23 with Dr. Talmage Nap.  He previously was also  treated with methimazole, but became neutropenic and has not been a  candidate for treatment either PTU on the methimazole.  He says since  his ablation he has been noted to be more short of breath and  tachycardic.  He actually had an admission for abdominal pain which was  thought to be due to acute cholecystitis.  He has noticed swelling and  orthopnea and PND over the past couple of days.  He saw Dr. Talmage Nap on  January 5.  She noted him to be very tachycardic with a heart rate of  150.  She switched his atenolol over propranolol and increased the dose.  Looking at his EKG at this time, it appears that he was in atrial  flutter.  He has gotten worse over the past 2 days and he has come to  see me today.   As above, he is markedly short of breath.  He can not to do any activity  without being dyspneic.  He is also very uncomfortable at night and  finds it hard to sleep.  He has noted extreme lower extremity edema.  The  remainder of the review of systems is negative.  He denies any  bleeding.  No fevers or chills.  No focal neurologic deficits.  He has  lost almost 50 pounds.   PAST MEDICAL HISTORY:  1. Possible asthma.  2. Grave's disease with hypothyroidism as described above.   CURRENT MEDICATIONS:  1. Propranolol 80 mg a day.  2. Prednisone 40 a day.  3. Symbicort.   ALLERGIES:  METHIMAZOLE, which cause his neutropenia   SOCIAL HISTORY:  He is married.  He works as a Environmental consultant.  He does not smoke.  He said he quit smoking in February of  1990.   FAMILY HISTORY:  Negative for significant cardiovascular disease.   He did he does have a history of asthma in his brother and his sister.   PHYSICAL EXAM:  CONSTITUTIONAL:  He is an ill appearing male sitting on  the exam table.  He is dyspneic and cachectic.  He is using his  accessory muscles to breathe.  VITAL SIGNS:  He is afebrile.  Blood pressure is 115/70, heart rate is  135.  HEENT:  He has temporal wasting.  There is no significant exophthalmos.  NECK:  Supple.  His JVP is elevated, probably about 10 cm.  There are  prominent CV waves.  His goiter is firm and very enlarged.  The carotids  are 2+ bilaterally.  CARDIAC:  His PMI is mildly displaced laterally.  He is tachycardiac.  He has a gallop.  There is an S4 ejection murmur at the left sternal  border.  LUNGS:  Clear.  ABDOMEN:  He is distended.  He has significant hepatomegaly with a  pulsatile liver.  There is no obvious splenomegaly.  He has hypoactive  bowel sounds.  EXTREMITIES:  Warm.  He has diffuse at least 3+ edema up to the thigh.  There is no rash.  NEURO:  He is alert and oriented x3.  Cranial nerves II-XII intact.  Moves all 4 extremities without difficulty.  Affect is pleasant, if not  somewhat nervous.   EKG shows atrial fibrillation with rapid ventricular response at a rate  133, no significant ST-T wave abnormalities.  Current labs show a  white  count 4.7, hemoglobin 10.8, hematocrit of 31, platelets of 47,000.  Sodium is 143, potassium is 4.0, chloride is 106, glucose is 1038, BUN  is 16, creatinine 0.5.  BNP is 700.  TSH is 0.02, albumin is 3.3.  Echocardiogram shows an EF of 55-60%.  His RV is dilated with septal  flattening suggesting elevated right-sided pressures.  He has moderate  TR a moderate to severe mitral regurgitation.   ASSESSMENT:  1. Acute high output congestive heart failure secondary to thyroid      toxicosis.  2. Atrial fibrillation with rapid ventricular response.  3. Anasarca.  4. 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  5. Intolerance of PTU and methimazole.  6. 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   PLAN:  Will admit him to the ICU.  Will rate control him with Lopressor  as I am not sure he can absorb oral propranolol.  We will check diurese  him with Lasix and treat him with heparin.  Watching his platelets and  hemoglobin closely.  Will do a VQ scan to check for PE given his results  on his echocardiogram.  We will also treat him with high-dose prednisone  for his thyroiditis.  He is quite tenuous.   I did discuss the situation with Dr. Talmage Nap.  She is aware and will  follow him from an endocrine standpoint.      Bevelyn Buckles. Bensimhon, MD  Electronically Signed     DRB/MEDQ  D:  06/24/2008  T:  06/24/2008  Job:  [id number]   cc:   Dorisann Frames, M.D.

## 2010-10-31 NOTE — Discharge Summary (Signed)
NAMECAYDIN, YEATTS              ACCOUNT NO.:  [id number]   MEDICAL RECORD NO.:  [id number]          PATIENT TYPE:  INP   LOCATION:  4711                         FACILITY:  [facility]   PHYSICIAN:  Bevelyn Buckles. Bensimhon, MDDATE OF BIRTH:  10/15/1955   DATE OF ADMISSION:  06/24/2008  DATE OF DISCHARGE:  06/30/2008                               DISCHARGE SUMMARY   ADDENDUM   Upon review of his home medications, it was noted that he has been on  nebulizers in the past and has been using them here in the hospital for  shortness of breath.  He had been on DuoNebs prior to admission, which  is a combination of albuterol and Atrovent.  Here in the hospital, he  has been doing well on Xopenex and Atrovent.  This will therefore be  continued.  To that end, an addendum to his medications on discharge:  1. Coumadin dose to keep an INR of 2-3, 5 mg tablets, #45 dispensed      with refills and the Coumadin check arranged on Friday, July 02, 2008, at noon.  2. Xopenex 0.63 mg hand-held nebulizer q.i.d. p.r.n.  3. Atrovent 0.5 mg hand-held nebulizer q.i.d. p.r.n.   All other discharge instructions and medications are unchanged.      Theodore Demark, PA-C      Bevelyn Buckles. Bensimhon, MD  Electronically Signed    RB/MEDQ  D:  06/30/2008  T:  07/01/2008  Job:  [id number]

## 2010-10-31 NOTE — Discharge Summary (Signed)
NAMEJOSHUE, [name]              ACCOUNT NO.:  [id number]   MEDICAL RECORD NO.:  [id number]          PATIENT TYPE:  INP   LOCATION:  4711                         FACILITY:  [facility]   PHYSICIAN:  Bevelyn Buckles. Bensimhon, MDDATE OF BIRTH:  [birth year]   DATE OF ADMISSION:  06/24/2008  DATE OF DISCHARGE:  06/30/2008                               DISCHARGE SUMMARY   DISCHARGE DIAGNOSES:  1. Congestive heart failure secondary to high output state and atrial      fibrillation/flutter.  2. Thyrotoxicosis secondary to Graves disease status post I-131      ablation in December 2009.  3. Atrial fibrillation/flutter with rapid ventricular response.   SECONDARY DIAGNOSES:  1. 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  2. Underlying asthma/chronic obstructive pulmonary disease.  3. Intolerance of methimazole and PTU due to previous neutropenia.   HOSPITAL COURSE:  [name] is a very pleasant 55 year old man who is  the husband of [name] in our medical records department in the office.  He  has a history of asthma.  For the past 2 years, he has been suffering  from hyperthyroidism secondary to Graves' disease.  He underwent  radioactive iodine ablation on December 23 with Dr. Dorisann Frames.  He  presented to our office on January 7 with extreme dyspnea and  tachycardia.  He was found to have rapid atrial fibrillation with heart  rates up to 150.  He also had significant volume overload consistent  with congestive heart failure.  He was admitted from the office to the  ICU with thyrotoxicosis.   While in the office, he did have an echocardiogram that showed ejection  fraction of 55-60% with a dilated RV with septal flattening suggestive  of elevated right-sided pressures.  He had moderate tricuspid  regurgitation and moderate to severe mitral regurgitation.  On arrival  to the intensive care unit.  He was started on esmolol drip as well as  bolus dose of Diltiazem.  However, we were  unable to get his heart rate  under good control.  He was ordered to undergo a VQ scan to rule out  pulmonary emboli, given his abnormal right heart on echocardiogram.  Unfortunately this was changed to radiology and he underwent a CT scan  of the chest.  This did not show any evidence of pulmonary emboli.  Given his inadequate heart rate control and inability to receive intact  thyroid medication, such as PTU methimazole due to previous neutropenia  secondary to these agents, he was treated with high-dose steroids to  help with thyrotoxicosis.  He also underwent TEE guided cardioversion.  TEE showed an EF of 60%.  There was no left atrial appendage thrombus.  Unfortunately, he converted only briefly and then went back to atrial  fibrillation/flutter.  He was then started on Tikosyn.  He briskly  converted to sinus rhythm within 24 hours.  He did have some episodes of  breakthrough atrial fibrillation and flutter, but these were typically  short lasting.  We proceeded with IV diuresis and his respiratory status  improved greatly.  Thyroid labs showed that his TSH was  rising slowly.  I discussed this with Dr. Talmage Nap.  She thought that his radioactive  iodine was working well to control his hyperactive thyroid and in fact  his goiter did shrink considerably while in the hospital.   While in the hospital, given his pancytopenia, he was also seen by Dr.  Clelia Croft of hematology.  It was thought that this was due to autoimmune  disorder.  Most prominent was his thrombocytopenia.  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  However, we are still pending the  final confirmatory testing.  He was initially treated with heparin.  This was switched over to Refludan, and then he was loaded with  Coumadin.   On the morning of discharge he was feeling well.  He was ambulating the  room without difficulty.  His volume status looked quite good.   VITAL SIGNS:  Weight on admission was 60.8 kg,  weight on discharge was  69 kg.  Blood pressure is 117/68, heart rate was 89.   MEDICATIONS ON DISCHARGE:  1. Prednisone 40 mg a day.  2. Lasix 40 b.i.d.  3. Potassium 20 b.i.d.  4. Propranolol 120 b.i.d.  5. Tikosyn 500 b.i.d.  6. Coumadin dosed to keep an INR of 2 to 3.  7. Protonix 40 a day.  8. Symbicort (obviously, which can be held until oral prednisone      stopped).   FOLLOWUP:  1. Coumadin Clinic with Dr. Shelby Dubin this Friday.  2. He will follow up with me next week in the office with a BMET.  3. He will follow up with Dr. Dorisann Frames, endocrinology, February      5 at 12 noon.  4. He will follow up with Dr. Clelia Croft in hematology in approximately 4      weeks.      Bevelyn Buckles. Bensimhon, MD  Electronically Signed     DRB/MEDQ  D:  06/30/2008  T:  06/30/2008  Job:  [id number]

## 2010-10-31 NOTE — Letter (Signed)
November 16, 2008     RE:  [name], [name]  MRN:  [id number]  /  DOB:  02-Apr-1956   To Whom It May Concern:   [name] has been under my care in the Cardiology Clinic for  congestive heart failure and atrial fibrillation in the setting of  significant hyperthyroidism since January of 2010.  Over the past few  months, he has been quite sick and has required at least 1 prolonged  hospitalization.  I have seen him multiple times in clinic and have kept  him out of work and from driving.  I have just recently released him to  go back to work in May and allowed him to drive.   During this time, he missed a court appearance apparently on August 30, 2008, based on his medical condition, I would ask that he be excused  from this visit and his court date be rescheduled to some time in the  future.   Please do not hesitate to call me if you have any questions.   Sincerely,   Nicholes Mango, MD  Co-director, [facility]  [facility]    Sincerely,      Bevelyn Buckles. Bensimhon, MD  Electronically Signed    DRB/MedQ  DD: 11/16/2008  DT: 11/16/2008  Job #: [id number]

## 2010-10-31 NOTE — Cardiovascular Report (Signed)
NAMEALMUS, WOODHAM              ACCOUNT NO.:  [id number]   MEDICAL RECORD NO.:  [id number]          PATIENT TYPE:  INP   LOCATION:  2907                         FACILITY:  [facility]   PHYSICIAN:  Bevelyn Buckles. Bensimhon, MDDATE OF BIRTH:  Dec 26, 1955   DATE OF PROCEDURE:  06/25/2008  DATE OF DISCHARGE:                            CARDIAC CATHETERIZATION   PATIENT IDENTIFICATION:  Mr. Buckwalter is a 55 year old male who was  admitted with congestive heart failure in the setting of thyroid  toxicosis and atrial fibrillation with rapid ventricular response.  We  have been unable to control his heart rate response even with high-dose  beta blockers.  Thus, I have arranged for a TEE-guided cardioversion.   This was a difficult procedure.  He required a significant amount of  sedation in order to pass the scope.  The scope was eventually passed  without any difficulty.  Preliminary TE report showed left ventricular  ejection fraction of approximately 60%.  There was moderate mitral  regurgitation and mild tricuspid regurgitation.  The left atrial  appendage was free of any thrombus.  There was no significant atrial  septal defect.   After appropriate sedation, he received 150 joules biphasic synchronized  shock which initially converted him to sinus rhythm for a few beats and  then he looked to be in atrial flutter at 150 beats per minute.  He was  then re-shocked again with another 150 joules shock, once he got brief  conversion to a sinus rhythm for one or two beats and then back to  atrial fibrillation, rates in the 120s.   I discussed this with EP.  We will start him on Tikosyn and continue him  on heparin.  There were no apparent complications.      Bevelyn Buckles. Bensimhon, MD  Electronically Signed     DRB/MEDQ  D:  06/25/2008  T:  06/26/2008  Job:  [id number]

## 2010-11-03 NOTE — Assessment & Plan Note (Signed)
[facility]                             PULMONARY OFFICE NOTE   [name], [name]                       MRN:          [id number]  DATE:08/01/2006                            DOB:          1955/11/16    CHIEF COMPLAINT:  Shortness of breath.   HISTORY:  A 55 year old black male last seen here over 3 years ago and  therefore being seen as a new patient today for evaluation of acute on  chronic asthma.  The patient was previously maintained on Advair 250/50  one puff q.a.m.  Complaining of daily cough of some thick brown mucus.  Status post smoking cessation in 1989 and then acutely became ill on  February 8 with hacking cough productive of slightly bloody sputum and  worsening dyspnea with increasing need for albuterol in the form of a  nebulizer prior to coming to the office today.  He denies any pleuritic  pain, fevers, chills, sweats, orthopnea, PND, or leg swelling, overt  reflux symptoms, myalgias, arthralgias, or rigors.   He was already treated by his primary physician with Zithromax 3 days  ago but feels no better.   PAST MEDICAL HISTORY:  Chronic obstructive pulmonary disease with an  asthmatic component.   OPERATIONS:  None major.   MEDICATIONS:  1. Advair 250/50 one q.a.m.  2. Albuterol p.r.n. in both MDI and nebulizer form.   ALLERGIES:  NONE.  THEO-DUR WAS POORLY TOLERATED IN THE PAST.   FAMILY HISTORY:  Positive for asthma in his brother and sister.   SOCIAL HISTORY:  Quit smoking in February 1990 and works as a Veterinary surgeon.   REVIEW OF SYSTEMS:  Taken in detail and is essentially negative except  as outlined above.   PHYSICAL EXAMINATION:  This is a pleasant ambulatory black male who does  not appear toxic or with increased work or breathing, however this exam  was done within 4 hours of his last albuterol.  He is afebrile, no vital signs.  HEENT:  Unremarkable, oropharynx clear, he does have upper and lower  dentures in  place.  NECK:  Supple without cervical adenopathy, tenderness, trachea is  midline, no thyromegaly.  LUNGS:  Fields reveal pan-expiratory wheezing bilaterally with adequate  but diminished air movement.  He has a regular rhythm without murmur, gallop, or rub.  ABDOMEN:  Soft, benign.  EXTREMITIES:  Warm without calf tenderness, cyanosis, clubbing, or  edema.   MDI technique was reviewed and improved from 50% - 75% with coaching.   Chest x-ray showed hyperinflation/COPD but no acute infiltrates or  effusions.   IMPRESSION:  1. This patient most likely has chronic obstructive pulmonary disease      with a chronic asthmatic component that has been Advair      responsive but without complete elimination of all of his symptoms      which mostly consist of daily cough, suggestive of possible myxoid      dysfunction and/or side effect from Advair.  My recommendation at      this point is switch over to Symbicort 160/4.5 two puffs b.i.d.  having documented that he does have adequate metered-dose inhaler      technique.  2. His present upper respiratory infection appears more viral then      bacterial and I think Zithromax is a reasonable choice, but would      have a low threshold to switch over to Levaquin or Avelox should      purulent sputum or fever recur.  3. He needs to understand better how to use p.r.n.'s and I have      reviewed with him optimal therapy with Mucinex DM 2 b.i.d. p.r.n.      and albuterol (I would prefer metered-dose inhaler over nebulizer      and use the nebulizer as a last resort).   I did give him a short course of Zegerid at bedtime and also a 6 day  course of prednisone until he returns for followup, and at that time we  would like to schedule a full set of pulmonary function tests to re-  establish a baseline going forward.     Charlaine Dalton. Sherene Sires, MD, [facility]  Electronically Signed    MBW/MedQ  DD: 08/01/2006  DT: 08/01/2006  Job #: [id number]   cc:    Fleet Contras, M.D.

## 2011-03-23 LAB — COMPREHENSIVE METABOLIC PANEL
ALT: 26
AST: 27
Albumin: 3.7
Alkaline Phosphatase: 111 U/L (ref 39–117)
Alkaline Phosphatase: 73
BUN: 7 mg/dL (ref 6–23)
BUN: 12
CO2: 27 mEq/L (ref 19–32)
CO2: 25
Calcium: 9.5
Chloride: 107 mEq/L (ref 96–112)
Chloride: 107
Creatinine, Ser: 0.57 mg/dL (ref 0.4–1.5)
Creatinine, Ser: 0.69
GFR calc Af Amer: >60
GFR calc non Af Amer: >60 mL/min (ref >60)
GFR calc non Af Amer: >60
Glucose, Bld: 116 mg/dL — ABNORMAL HIGH (ref 70–99)
Glucose, Bld: 103 — ABNORMAL HIGH
Potassium: 3.9 mEq/L (ref 3.5–5.1)
Potassium: 3.9
Sodium: 139
Total Bilirubin: 2.9 mg/dL — ABNORMAL HIGH (ref 0.3–1.2)
Total Bilirubin: 2.9 — ABNORMAL HIGH
Total Protein: 6

## 2011-03-23 LAB — RAPID URINE DRUG SCREEN, HOSP PERFORMED
Amphetamines: NOT DETECTED
Barbiturates: NOT DETECTED
Benzodiazepines: NOT DETECTED
Cocaine: NOT DETECTED
Opiates: NOT DETECTED
Tetrahydrocannabinol: NOT DETECTED

## 2011-03-23 LAB — LIPASE, BLOOD
Lipase: 22 U/L (ref 11–59)
Lipase: 60 — ABNORMAL HIGH

## 2011-03-23 LAB — URINALYSIS, ROUTINE W REFLEX MICROSCOPIC
Bilirubin Urine: NEGATIVE
Glucose, UA: NEGATIVE mg/dL
Hgb urine dipstick: NEGATIVE
Ketones, ur: NEGATIVE mg/dL
Nitrite: NEGATIVE
Protein, ur: NEGATIVE mg/dL
Specific Gravity, Urine: 1.007 (ref 1.005–1.030)
Urobilinogen, UA: 1 mg/dL (ref 0.0–1.0)
pH: 8 (ref 5.0–8.0)

## 2011-03-23 LAB — CBC
HCT: 32.2 % — ABNORMAL LOW (ref 39.0–52.0)
HCT: 43.6
Hemoglobin: 10.9 g/dL — ABNORMAL LOW (ref 13.0–17.0)
Hemoglobin: 14.9
MCHC: 33.7 g/dL (ref 30.0–36.0)
MCHC: 34.3
MCV: 87.1 fL (ref 78.0–100.0)
MCV: 88.5
Platelets: 62 K/uL — ABNORMAL LOW (ref 150–400)
Platelets: 111 — ABNORMAL LOW
RBC: 3.69 MIL/uL — ABNORMAL LOW (ref 4.22–5.81)
RBC: 4.93
RDW: 14.5 % (ref 11.5–15.5)
RDW: 13.6
WBC: 4.4 10*3/uL (ref 4.0–10.5)
WBC: 6.3

## 2011-03-23 LAB — AMYLASE: Amylase: 94

## 2011-03-23 LAB — COMPREHENSIVE METABOLIC PANEL WITH GFR
ALT: 34 U/L (ref 0–53)
AST: 28 U/L (ref 0–37)
Albumin: 3.2 g/dL — ABNORMAL LOW (ref 3.5–5.2)
Calcium: 9.2 mg/dL (ref 8.4–10.5)
GFR calc Af Amer: >60 mL/min (ref >60)
Sodium: 140 meq/L (ref 135–145)
Total Protein: 5.3 g/dL — ABNORMAL LOW (ref 6.0–8.3)

## 2011-03-23 LAB — I-STAT 8, (EC8 V) (CONVERTED LAB)
Acid-Base Excess: 2
Bicarbonate: 26.3 — ABNORMAL HIGH
HCT: 42
Operator id: 247071
pCO2, Ven: 40.1 — ABNORMAL LOW

## 2011-03-23 LAB — DIFFERENTIAL
Basophils Absolute: 0 10*3/uL (ref 0.0–0.1)
Basophils Absolute: 0
Basophils Relative: 0 % (ref 0–1)
Basophils Relative: 0
Eosinophils Absolute: 0 K/uL (ref 0.0–0.7)
Eosinophils Absolute: 0.1 — ABNORMAL LOW
Eosinophils Relative: 0 % (ref 0–5)
Eosinophils Relative: 1
Lymphocytes Relative: 9 % — ABNORMAL LOW (ref 12–46)
Lymphocytes Relative: 13
Lymphs Abs: 0.4 10*3/uL — ABNORMAL LOW (ref 0.7–4.0)
Lymphs Abs: 0.8
Monocytes Absolute: 0.2 10*3/uL (ref 0.1–1.0)
Monocytes Absolute: 0.2
Monocytes Relative: 4 % (ref 3–12)
Monocytes Relative: 3
Neutro Abs: 3.8 10*3/uL (ref 1.7–7.7)
Neutro Abs: 5.3
Neutrophils Relative %: 87 % — ABNORMAL HIGH (ref 43–77)
Neutrophils Relative %: 84 — ABNORMAL HIGH

## 2011-03-23 LAB — POCT URINALYSIS DIP (DEVICE)
Bilirubin Urine: NEGATIVE
Hgb urine dipstick: NEGATIVE
Ketones, ur: 15 — AB
pH: 7

## 2011-07-31 ENCOUNTER — Other Ambulatory Visit: Payer: Self-pay | Admitting: Emergency Medicine

## 2011-07-31 MED ORDER — BUDESONIDE-FORMOTEROL FUMARATE 160-4.5 MCG/ACT IN AERO: 2.0000 | INHALATION_SPRAY | Freq: Two times a day (BID) | RESPIRATORY_TRACT | Status: DC

## 2011-09-17 ENCOUNTER — Emergency Department (HOSPITAL_COMMUNITY)
Admission: EM | Admit: 2011-09-17 | Discharge: 2011-09-17 | Disposition: A | Discharge status: Home / Self Care | Payer: 59 | Attending: Emergency Medicine | Admitting: Emergency Medicine

## 2011-09-17 ENCOUNTER — Encounter (HOSPITAL_COMMUNITY): Payer: Self-pay | Admitting: *Deleted

## 2011-09-17 DIAGNOSIS — E0590 Thyrotoxicosis, unspecified without thyrotoxic crisis or storm: Secondary | ICD-10-CM | POA: Insufficient documentation

## 2011-09-17 DIAGNOSIS — H9220 Otorrhagia, unspecified ear: Secondary | ICD-10-CM

## 2011-09-17 DIAGNOSIS — H9210 Otorrhea, unspecified ear: Secondary | ICD-10-CM | POA: Insufficient documentation

## 2011-09-17 HISTORY — DX: Thyrotoxicosis, unspecified without thyrotoxic crisis or storm: E05.90

## 2011-09-17 NOTE — ED Provider Notes (Signed)
History     CSN: [id number]  Arrival date & time 09/17/11  1453   First MD Initiated Contact with Patient 09/17/11 1659      Chief Complaint  Patient presents with  . Ear Injury    (Consider location/radiation/quality/duration/timing/severity/associated sxs/prior treatment) The history is provided by the patient.   Patient of bleeding from the right ear canal after possible trauma. No hearing loss. Bleeding has been controlled. He has been sticking his finger in his ear which causes it to bleed more. No other complaints of Past Medical History  Diagnosis Date  . Hyperthyroidism     History reviewed. No pertinent past surgical history.  History reviewed. No pertinent family history.  History  Substance Use Topics  . Smoking status: Never Smoker   . Smokeless tobacco: Not on file  . Alcohol Use: No      Review of Systems  All other systems reviewed and are negative.    Allergies  Other and Theophyllines  Home Medications   Current Outpatient Rx  Name Route Sig Dispense Refill  . BUDESONIDE-FORMOTEROL FUMARATE 160-4.5 MCG/ACT IN AERO Inhalation Inhale 2 puffs into the lungs 2 (two) times daily.    Marland Kitchen LEVOTHYROXINE SODIUM 75 MCG PO TABS Oral Take 75 mcg by mouth daily.      BP 139/94  Pulse 68  Temp(Src) 97.8 F (36.6 C) (Oral)  Resp 18  SpO2 99%  Physical Exam  Nursing note and vitals reviewed. Constitutional: He is oriented to person, place, and time. He appears well-developed and well-nourished.  Non-toxic appearance. No distress.  HENT:  Head: Normocephalic and atraumatic.       Right ear canal with bleeding. TM is normal.  Eyes: Conjunctivae, EOM and lids are normal. Pupils are equal, round, and reactive to light.  Neck: Normal range of motion. Neck supple. No tracheal deviation present. No mass present.  Cardiovascular: Normal rate, regular rhythm and normal heart sounds.  Exam reveals no gallop.   No murmur heard. Pulmonary/Chest: Effort normal  and breath sounds normal. No stridor. No respiratory distress. He has no decreased breath sounds. He has no wheezes. He has no rhonchi. He has no rales.  Abdominal: Soft. Normal appearance and bowel sounds are normal. He exhibits no distension. There is no tenderness. There is no rebound and no CVA tenderness.  Musculoskeletal: Normal range of motion. He exhibits no edema and no tenderness.  Neurological: He is alert and oriented to person, place, and time. He has normal strength. No cranial nerve deficit or sensory deficit. GCS eye subscore is 4. GCS verbal subscore is 5. GCS motor subscore is 6.  Skin: Skin is warm and dry. No abrasion and no rash noted.  Psychiatric: He has a normal mood and affect. His speech is normal and behavior is normal.    ED Course  Procedures (including critical care time)  Labs Reviewed - No data to display No results found.   No diagnosis found.    MDM  Patient instructed to avoid any digital or traumatic penetration to his ear canal. And 2 return if the bleeding gets worse        Toy Baker, MD 09/17/11 1709

## 2011-09-17 NOTE — Discharge Instructions (Signed)
Do not place anything in your ear. The bleeding should stop within 24 hours. Return here if it does not

## 2011-09-17 NOTE — ED Notes (Signed)
Pt stated that he went to scratch his right ear earlier today and it begin to bleed. No loss of hearing. Dried blood seen in ear. No redness or inflammation noted. Pt stated that he did not have any injury or trauma to ear. Will continue to monitor.

## 2011-09-17 NOTE — ED Notes (Signed)
Pt reports his ear was itching, when pt stuck his finger in his ear he noted that it was bleeding bright red blood.  Pt is noted to have some dried blood on the outside of his ear.  Denies injury or pain.

## 2011-10-02 ENCOUNTER — Encounter: Payer: Self-pay | Admitting: Emergency Medicine

## 2011-11-09 ENCOUNTER — Other Ambulatory Visit: Payer: Self-pay | Admitting: Emergency Medicine

## 2011-11-09 NOTE — Telephone Encounter (Signed)
Pt was last seen by Dr. Delton Coombes on Jun 27, 2010.  No pending appts.

## 2011-12-03 ENCOUNTER — Telehealth: Payer: Self-pay | Admitting: Emergency Medicine

## 2011-12-03 MED ORDER — BUDESONIDE-FORMOTEROL FUMARATE 160-4.5 MCG/ACT IN AERO: 2.0000 | INHALATION_SPRAY | Freq: Two times a day (BID) | RESPIRATORY_TRACT | Status: DC

## 2011-12-03 NOTE — Telephone Encounter (Signed)
Spoke with pt requesting rx for symbicort I sent the Rx to the pharmacy.  Nothing further needed

## 2012-01-24 ENCOUNTER — Ambulatory Visit (INDEPENDENT_AMBULATORY_CARE_PROVIDER_SITE_OTHER): Payer: 59 | Admitting: Emergency Medicine

## 2012-01-24 ENCOUNTER — Ambulatory Visit (INDEPENDENT_AMBULATORY_CARE_PROVIDER_SITE_OTHER): Payer: 59 | Admitting: Gastroenterology

## 2012-01-24 ENCOUNTER — Encounter: Payer: Self-pay | Admitting: Emergency Medicine

## 2012-01-24 VITALS — BP 140/88 | HR 62 | Temp 97.8°F | Ht 70.0 in | Wt 184.0 lb

## 2012-01-24 DIAGNOSIS — B182 Chronic viral hepatitis C: Secondary | ICD-10-CM

## 2012-01-24 DIAGNOSIS — J3089 Other allergic rhinitis: Secondary | ICD-10-CM

## 2012-01-24 DIAGNOSIS — J45901 Unspecified asthma with (acute) exacerbation: Secondary | ICD-10-CM

## 2012-01-24 DIAGNOSIS — J309 Allergic rhinitis, unspecified: Secondary | ICD-10-CM | POA: Insufficient documentation

## 2012-01-24 LAB — CBC WITH DIFFERENTIAL/PLATELET
Eosinophils Absolute: 0.1 10*3/uL (ref 0.0–0.7)
Eosinophils Relative: 2 % (ref 0–5)
Hemoglobin: 16.7 g/dL (ref 13.0–17.0)
Lymphocytes Relative: 39 % (ref 12–46)
Lymphs Abs: 1.3 10*3/uL (ref 0.7–4.0)
MCH: 32.1 pg (ref 26.0–34.0)
MCV: 88.1 fL (ref 78.0–100.0)
Monocytes Relative: 11 % (ref 3–12)
Neutrophils Relative %: 47 % (ref 43–77)
RBC: 5.2 MIL/uL (ref 4.22–5.81)
WBC: 3.2 10*3/uL — ABNORMAL LOW (ref 4.0–10.5)

## 2012-01-24 MED ORDER — BUDESONIDE-FORMOTEROL FUMARATE 160-4.5 MCG/ACT IN AERO: 2.0000 | INHALATION_SPRAY | Freq: Two times a day (BID) | RESPIRATORY_TRACT | Status: DC

## 2012-01-24 MED ORDER — AZITHROMYCIN 250 MG PO TABS: 250.0000 mg | ORAL_TABLET | Freq: Every day | ORAL | Status: AC

## 2012-01-24 MED ORDER — LORATADINE 10 MG PO TABS: 10.0000 mg | ORAL_TABLET | Freq: Every day | ORAL | Status: DC

## 2012-01-24 NOTE — Progress Notes (Signed)
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 Had repeat thyroid ablation (Dr Talmage Nap), and since then A fib and diastolic dysfxn improved. Has been released by Dr Gala Romney with these improvements.   [name] 04/12/09 -- Continues to use Symbicort two times a day, not sure that it helps him but he uses it reliably. Uses xopenex occasionally, especially when he cuts the grass. rec wean off symbicort and did worse off so restart and since then no need for xopenex routinely.   August 31, 2009 Acute visit. Pt c/o chest congestion x 2wks. He also states that he has had fever/chills and aches. Pt also c/o prod cough with thick clear sputum. Misunderstood instructions and stopped symbicort at onset and changed to xopenex.   [name] 09/26/09 -- Was seen acutely in 3/11 as above in setting URI. Now feeling better after rx with prednisone. He is still having a globus sensation, no PND, minimal cough. Not sure whether the Symbicort is related to the UA irritation. Also notes that 6 months ago he noticed a red, sometimes dried rash on his R forearm. Not pigmented or brown. He was asked to take prilosec but has not done so.   [name] 06/27/10 -- follows up for asthma/COPD, allergies and congestion. He has been doing well, going to the gym biw. Breathing has been good, doesn't need SABA. Has 3 cats and a dog - bothering allergies, bothering breathing some. He is looking to get an air purifier, hopes to avoid going on allergy meds. No flares, no hospitalizations since last time.  [name] 01/24/12 -- COPD/asthma, allergies with cough. Has a hx high output heart failure from hyperthyroidism - resolved. Has done quite well since last time, but presents  today with allergic eyes, some cough, worsening dyspnea with conversation. No real wheeze. He has been on Symbicort, rare xopenex use.  Doesn't believe he is having PND. Lots of throat clearing.    Filed Vitals:   01/24/12 1651  BP: 140/88  Pulse: 62  Temp: 97.8 F (36.6 C)   Gen: Pleasant, well-nourished, in no distress,  normal affect  ENT: No lesions,  mouth clear,  oropharynx clear, no postnasal drip  Neck: No JVD, no TMG, no carotid bruits  Lungs: No use of accessory muscles, no dullness to percussion, clear without rales or rhonchi  Cardiovascular: RRR, heart sounds normal, no murmur or gallops, no peripheral edema  Abdomen: soft and NT, no HSM,  BS normal  Musculoskeletal: No deformities, no cyanosis or clubbing  Neuro: alert, non focal  Skin: Warm, no lesions or rashes  Allergic rhinitis due to other allergen Flare recently with possible associated bronchitis.  - start loratadine x 3 weeks - stop symbicort for a few days, then restar - azithro x 5 days - rov 1 yr  ASTHMA, ACUTE This does not appear to be the active problem - refill symbicort - prn xopenex

## 2012-01-24 NOTE — Patient Instructions (Addendum)
Try stopping your Symbicort for a few days to give your throat and upper airway some time to clear, then restart it Start loratadine 10mg  daily for 3 weeks Take azithromycin as directed Call our office if you are not improving Follow with Dr Delton Coombes in 1 year or sooner if you have any problems.

## 2012-01-24 NOTE — Assessment & Plan Note (Signed)
Flare recently with possible associated bronchitis.  - start loratadine x 3 weeks - stop symbicort for a few days, then restar - azithro x 5 days - rov 1 yr

## 2012-01-24 NOTE — Assessment & Plan Note (Signed)
This does not appear to be the active problem - refill symbicort - prn xopenex

## 2012-01-25 LAB — COMPLETE METABOLIC PANEL WITH GFR
ALT: 16 U/L (ref 0–53)
CO2: 28 mEq/L (ref 19–32)
Calcium: 9.5 mg/dL (ref 8.4–10.5)
Chloride: 101 mEq/L (ref 96–112)
GFR, Est African American: >89 mL/min
GFR, Est Non African American: 87 mL/min
Glucose, Bld: 93 mg/dL (ref 70–99)
Sodium: 138 mEq/L (ref 135–145)
Total Bilirubin: 1.1 mg/dL (ref 0.3–1.2)
Total Protein: 6.9 g/dL (ref 6.0–8.3)

## 2012-01-31 NOTE — Progress Notes (Signed)
NAMEGIORGI, [name]    MR#:  [id number]      DATE:  01/24/2012  DOB:  22-Jan-1956         referring physician: Adrian Prince, MD, [facility], [address], [address] [address], [place], [place] [address], Fax: [phone]  primary care physician: Adrian Prince, MD, [facility], [address], [address] [address], [place], [place] [address], Fax: [phone]  CONSULTING PHYSICIANS:  1. Leslye Peer, MD, [facility], [address] [place], [address], [place], [place] [address], Fax: [phone]  2. Bevelyn Buckles. Bensimhon, MD, [facility], [address], [address], [place], [place] [address], Fax: [phone]   REASON FOR VISIT: Followup of genotype 1a hepatitis C.   History: The patient returns today unaccompanied. I have not seen him since 03/23/2010. He reports that he had moved to the [place] area for some time, where he was told that because his liver tests normal, that he did not need treatment for his hepatitis C. 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 His primary physician has asked him to return to [place] to re-evaluate his hepatitis C. He has fatigue, but other than that no symptoms directly referable to his history of hepatitis C. There is no history of variceal bleeding. He has never undergone a screening endoscopy for varices, as referred to my previous notes. There are no symptoms to suggest decompensated liver disease.   PAST MEDICAL HISTORY: Significant for asthma. He reports he is only required an inhaler and rarely has flares even with grass and pollen exposure as an example. He is seen by Dr. Delton Coombes for this. He is seeing him today for allergic rhinitis in the afternoon. There is a history of heart failure associated with pulmonary hypertension in the past by that required a heart catheterization on 08/25/2008. This issue seems to have been resolved and he is no longer  followed by Dr. Gala Romney.   CURRENT MEDICATIONS:  1. Synthroid dose unknown daily.  2. Symbicort 2 inhalations b.i.d.   ALLERGIES: Theophylline CAUSES NAUSEA AND skin rash.  habits: He quit smoking in February 1989. Alcohol: No alcohol consumption since 1997.   REVIEW OF SYSTEMS: All 10 systems reviewed today with the patient and they are negative other than complaints of symptoms of allergic rhinitis, for which he is seeing Dr. Delton Coombes today. His CES-D was 7.   physical examination: Constitutional: Well appearing without stigmata of chronic liver disease. Vital signs: Height 70 inches, weight 184 pounds, blood pressure 138/88, pulse of 55, temperature 97.7 degrees Fahrenheit.   There are no recent labs noted in Epic.   There are no recent imaging studies since an MRI of 04/12/2010, which did not show cirrhosis with portal hypertension.   ASSESSMENT: The patient is a 56 year old gentleman with a history of genotype 1a hepatitis C who has not been biopsied, but has an MRI of 04/12/2010 suggesting that he does not have cirrhosis, despite initial findings of a low platelet count of 96 at his first clinic appointment. I had previously stated I would check an IL-28B. However, in the last 1-1/2 years the value of an IL-28B has diminished and so will not be ordered one despite my previous note.   In terms of treatment of his hepatitis C, provided he remains well compensated, he would be a candidate for a combination of PEG interferon, ribavirin, and a direct acting antiviral. I would expect to use  Sofosbuvir because it would deliver a very good virologic response rate, but only require 12 weeks of therapy. This is not expect to be released until early December 2013. As such, I will hold off on therapy at this time.   In my discussion today with the patient, we discussed his previous lab results. We discussed waiting until the availability of Sofosbuvir, which he is very much in agreement  with.  plan: 1. Hepatitis A immune, hepatitis B, previously vaccinated.  2. We will check a CBC today, in addition to liver enzymes and INR to re-evaluate his liver function.  3. At this time, in the absence of cirrhosis there is no need for further [facility] screening.  4. If his platelet count remains over 100,000, he would not need of variceal screening.  5. He is to return in approximately 6 months' time, at which point we can discuss starting him on Sofosbuvir in combination with PEG interferon and ribavirin.  6. Of course, if Dr. Delton Coombes has any concerns about the use of interferon for his degree of asthma, I would appreciate hearing about it.              Brooke Dare, MD   ADDENDUM: His CBC still shows a low plt count at 106, but otherwise his labs show preserved synthetic function.  Given the lack of cirrhosis on his MRI when his plts were low, perhaps he has some degree of plt clumping.  Will recheck on return.  403 .[id number]  D:  Thu Aug 08 17:59:13 2013 ; T:  Thu Aug 08 23:43:49 2013  Job #:  [id number]

## 2012-04-22 ENCOUNTER — Encounter: Payer: Self-pay | Admitting: Pulmonary Disease

## 2012-04-22 ENCOUNTER — Ambulatory Visit (INDEPENDENT_AMBULATORY_CARE_PROVIDER_SITE_OTHER): Payer: 59 | Admitting: Pulmonary Disease

## 2012-04-22 ENCOUNTER — Telehealth: Payer: Self-pay | Admitting: Emergency Medicine

## 2012-04-22 VITALS — BP 138/80 | HR 67 | Temp 97.7°F | Ht 70.0 in | Wt 195.2 lb

## 2012-04-22 DIAGNOSIS — J449 Chronic obstructive pulmonary disease, unspecified: Secondary | ICD-10-CM

## 2012-04-22 DIAGNOSIS — J019 Acute sinusitis, unspecified: Secondary | ICD-10-CM

## 2012-04-22 MED ORDER — PREDNISONE 10 MG PO TABS: ORAL_TABLET | ORAL | Status: DC

## 2012-04-22 MED ORDER — LEVOFLOXACIN 750 MG PO TABS: 750.0000 mg | ORAL_TABLET | Freq: Every day | ORAL | Status: DC

## 2012-04-22 NOTE — Assessment & Plan Note (Signed)
The patient does not have any bronchospasm on exam currently, but is at high risk for decompensation with his current sinus infection and symptoms.  I have asked him to call us if his breathing worsens.

## 2012-04-22 NOTE — Addendum Note (Signed)
Addended by: Charlott Holler on: 04/22/2012 09:55 AM   Modules accepted: Orders

## 2012-04-22 NOTE — Telephone Encounter (Signed)
I spoke with pt and is scheduled to come in and see KC at 9:15 this AM for acute visit

## 2012-04-22 NOTE — Patient Instructions (Addendum)
Will treat with levaquin 750mg  one each day for 7 days for your sinus infection Will treat with a course of prednisone to help with inflammation Use neil med sinus rinses am and pm until better. Can take tylenol cold and sinus or something similar for next one week only to help with symptoms. Let us know if you are not improving.

## 2012-04-22 NOTE — Assessment & Plan Note (Signed)
The patient's history is most consistent with an acute sinusitis, and will need treatment with antibiotics and aggressive nasal hygiene regimen.  Will also add a short course of prednisone to help with upper and lower airway inflammation.

## 2012-04-22 NOTE — Progress Notes (Signed)
  Subjective:    Patient ID: [name], male    DOB: 1956-03-25, 56 y.o.   MRN: [id number]  HPI The patient comes in today for an acute sick visit.  He is usually followed for COPD/asthma, and gives a four-day history of increased sinus pressure and congestion, purulent mucus from his nose, as well as epistaxis and streaky hemoptysis.  This has subsequently moved down into his chest with some congestion, but he denies increased shortness of breath.  He is coughing up purulent mucus from his chest as well.  He denies any fevers, chills, or sweats.   Review of Systems  Constitutional: Negative for fever and unexpected weight change.  HENT: Positive for congestion, sore throat, rhinorrhea, trouble swallowing, postnasal drip and sinus pressure. Negative for ear pain, nosebleeds, sneezing and dental problem.   Eyes: Positive for redness. Negative for itching.  Respiratory: Positive for cough (pt coughing up some blood x 4 days), chest tightness, shortness of breath and wheezing.   Cardiovascular: Negative for palpitations and leg swelling.  Gastrointestinal: Negative for nausea and vomiting.  Genitourinary: Negative for dysuria.  Musculoskeletal: Negative for joint swelling.  Skin: Negative for rash.  Neurological: Positive for headaches.  Hematological: Does not bruise/bleed easily.  Psychiatric/Behavioral: Negative for dysphoric mood. The patient is not nervous/anxious.        Objective:   Physical Exam Well-developed male in no acute distress Nose with erythematous and edematous mucosa, no purulence or epistaxis seen Oropharynx clear Neck without lymphadenopathy or thyromegaly Chest with a few coarse breath sounds, but no crackles or wheezes Cardiac exam with regular rate and rhythm Lower extremities without edema, no cyanosis Alert and oriented, moves all 4 extremities.       Assessment & Plan:

## 2012-07-02 ENCOUNTER — Ambulatory Visit (HOSPITAL_COMMUNITY)
Admission: RE | Admit: 2012-07-02 | Discharge: 2012-07-02 | Disposition: A | Discharge status: Home / Self Care | Payer: 59 | Source: Ambulatory Visit | Attending: Internal Medicine | Admitting: Internal Medicine

## 2012-07-02 ENCOUNTER — Encounter (HOSPITAL_COMMUNITY): Payer: Self-pay

## 2012-07-02 VITALS — BP 138/82 | HR 56 | Wt 198.4 lb

## 2012-07-02 DIAGNOSIS — R0989 Other specified symptoms and signs involving the circulatory and respiratory systems: Secondary | ICD-10-CM | POA: Insufficient documentation

## 2012-07-02 DIAGNOSIS — E0590 Thyrotoxicosis, unspecified without thyrotoxic crisis or storm: Secondary | ICD-10-CM | POA: Insufficient documentation

## 2012-07-02 DIAGNOSIS — R0602 Shortness of breath: Secondary | ICD-10-CM | POA: Insufficient documentation

## 2012-07-02 DIAGNOSIS — R0609 Other forms of dyspnea: Secondary | ICD-10-CM | POA: Insufficient documentation

## 2012-07-02 DIAGNOSIS — R079 Chest pain, unspecified: Secondary | ICD-10-CM | POA: Insufficient documentation

## 2012-07-02 DIAGNOSIS — IMO0001 Reserved for inherently not codable concepts without codable children: Secondary | ICD-10-CM

## 2012-07-02 DIAGNOSIS — I5032 Chronic diastolic (congestive) heart failure: Secondary | ICD-10-CM | POA: Insufficient documentation

## 2012-07-02 LAB — CBC
HCT: 49.3 % (ref 39.0–52.0)
MCH: 31.9 pg (ref 26.0–34.0)
MCHC: 36.7 g/dL — ABNORMAL HIGH (ref 30.0–36.0)
MCV: 86.9 fL (ref 78.0–100.0)
Platelets: 103 10*3/uL — ABNORMAL LOW (ref 150–400)
RDW: 13.2 % (ref 11.5–15.5)
WBC: 3.4 10*3/uL — ABNORMAL LOW (ref 4.0–10.5)

## 2012-07-02 LAB — BASIC METABOLIC PANEL
BUN: 7 mg/dL (ref 6–23)
Calcium: 9.3 mg/dL (ref 8.4–10.5)
Chloride: 101 mEq/L (ref 96–112)
Creatinine, Ser: 0.93 mg/dL (ref 0.50–1.35)
GFR calc Af Amer: >90 mL/min (ref >90)
GFR calc non Af Amer: >90 mL/min (ref >90)

## 2012-07-02 NOTE — Patient Instructions (Addendum)
Follow up in 3 months  Schedule exercise stress test at [facility].

## 2012-07-02 NOTE — Assessment & Plan Note (Addendum)
Schedule treadmill stress test. Check ECHO. Check CBC, BMET, and thyroid panel.  Follow up in 3 months.   Patient seen and examined with Tonye Becket, NP. We discussed all aspects of the encounter. I agree with the assessment and plan as stated above. Symptoms fairly atypical but he has RFs for CAD and needs further risk stratification with ETT. Will also get f/u echo. If symptoms persist and work-up otherwise negative will consider chest imaging.

## 2012-07-02 NOTE — Progress Notes (Signed)
Patient ID: [name], male   DOB: 1955/07/03, 57 y.o.   MRN: [id number]  Weight Range   Baseline proBNP    PCP: Dr Evlyn Kanner Pulmonologist: Dr Delton Coombes  HPI: 57 y/o male with h/o life threatening hyperthyroidism, PAF, diastolic heart failure,  and COPD/asthma.  Admitted in 2010 with HF in setting of severe hyperthyroidism and AF with RVR.  08/23/2008 RHC Right atrial pressure mean of 19 RV pressure 73/7 with an EDP of 20 PA pressure 70/22 with a mean of 43 PCWP 23 with a V-wave to 38.  Fick cardiac output 6.0 with an index of 3.4. PVR 3.3 Woods units.  Had repeat I-131 ablation on 09/17/2008. Maintained SR. Off tikosyn and coumadin 2010.   He returns for follow up. Complains of dyspnea with exertion, chest pain, and dizziness for 2 weeks which occurred about 2 months ago. He noticed the symptoms at night. Resolved 1 month ago. He did not seek medical attention. Has not returned. However, now complains dyspnea with exertion. Not exercising. Doesn't feel like himself. Eager to get back to gym but wants to make sure it is safe.  ROS: All systems negative except as listed in HPI, PMH and Problem List.  Past Medical History  Diagnosis Date  . Hyperthyroidism     Current Outpatient Prescriptions  Medication Sig Dispense Refill  . budesonide-formoterol (SYMBICORT) 160-4.5 MCG/ACT inhaler Inhale 2 puffs into the lungs 2 (two) times daily.  1 Inhaler  3  . chlorpheniramine (CHLOR-TRIMETON) 4 MG tablet Take by mouth 2 (two) times daily as needed.      Marland Kitchen levothyroxine (SYNTHROID, LEVOTHROID) 75 MCG tablet Take 75 mcg by mouth daily.      Marland Kitchen levalbuterol (XOPENEX HFA) 45 MCG/ACT inhaler Inhale 1-2 puffs into the lungs every 4 (four) hours as needed.      . loratadine (CLARITIN) 10 MG tablet Take 1 tablet (10 mg total) by mouth daily.  30 tablet  4     PHYSICAL EXAM: Filed Vitals:   07/02/12 1246  BP: 138/82  Pulse: 56  Weight: 198 lb 6.4 oz (89.994 kg)  SpO2: 98%    General:  Well  appearing. No resp difficulty HEENT: normal Neck: supple. JVP flat. Carotids 2+ bilaterally; no bruits. No lymphadenopathy or thryomegaly appreciated. Cor: PMI normal. Regular rate & rhythm. No rubs, gallops or murmurs. Lungs: clear Abdomen: soft, nontender, nondistended. No hepatosplenomegaly. No bruits or masses. Good bowel sounds. Extremities: no cyanosis, clubbing, rash, edema Neuro: alert & orientedx3, cranial nerves grossly intact. Moves all 4 extremities w/o difficulty. Affect pleasant.   ECG: NSR 61 bpm. No ST-T wave abnormalities.     ASSESSMENT & PLAN:

## 2012-07-02 NOTE — Assessment & Plan Note (Signed)
Check labs today including CBC and thyroid. Also check echo.

## 2012-07-07 ENCOUNTER — Other Ambulatory Visit (HOSPITAL_COMMUNITY): Payer: Self-pay | Admitting: Internal Medicine

## 2012-07-07 DIAGNOSIS — R0600 Dyspnea, unspecified: Secondary | ICD-10-CM

## 2012-07-08 ENCOUNTER — Ambulatory Visit (HOSPITAL_COMMUNITY): Payer: 59 | Attending: Cardiology | Admitting: Radiology

## 2012-07-08 DIAGNOSIS — R0989 Other specified symptoms and signs involving the circulatory and respiratory systems: Secondary | ICD-10-CM | POA: Insufficient documentation

## 2012-07-08 DIAGNOSIS — I4891 Unspecified atrial fibrillation: Secondary | ICD-10-CM | POA: Insufficient documentation

## 2012-07-08 DIAGNOSIS — I10 Essential (primary) hypertension: Secondary | ICD-10-CM | POA: Insufficient documentation

## 2012-07-08 DIAGNOSIS — I509 Heart failure, unspecified: Secondary | ICD-10-CM | POA: Insufficient documentation

## 2012-07-08 DIAGNOSIS — Z87891 Personal history of nicotine dependence: Secondary | ICD-10-CM | POA: Insufficient documentation

## 2012-07-08 DIAGNOSIS — R0600 Dyspnea, unspecified: Secondary | ICD-10-CM

## 2012-07-08 DIAGNOSIS — R0602 Shortness of breath: Secondary | ICD-10-CM

## 2012-07-08 DIAGNOSIS — J449 Chronic obstructive pulmonary disease, unspecified: Secondary | ICD-10-CM | POA: Insufficient documentation

## 2012-07-08 DIAGNOSIS — R0609 Other forms of dyspnea: Secondary | ICD-10-CM | POA: Insufficient documentation

## 2012-07-08 DIAGNOSIS — J4489 Other specified chronic obstructive pulmonary disease: Secondary | ICD-10-CM | POA: Insufficient documentation

## 2012-07-08 NOTE — Progress Notes (Signed)
Echocardiogram performed.  

## 2012-07-21 ENCOUNTER — Ambulatory Visit (INDEPENDENT_AMBULATORY_CARE_PROVIDER_SITE_OTHER): Payer: 59 | Admitting: Physician Assistant

## 2012-07-21 DIAGNOSIS — R0602 Shortness of breath: Secondary | ICD-10-CM

## 2012-07-21 DIAGNOSIS — IMO0001 Reserved for inherently not codable concepts without codable children: Secondary | ICD-10-CM

## 2012-07-21 NOTE — Procedures (Addendum)
Exercise Treadmill Test  Pre-Exercise Testing Evaluation Rhythm: normal sinus  Rate: 72     Test  Exercise Tolerance Test Ordering MD: Arvilla Meres, MD  Interpreting MD: Tereso Newcomer PA-C  Unique Test No: 1  Treadmill:  1  Indication for ETT: CAD  Contraindication to ETT: No   Stress Modality: exercise - treadmill  Cardiac Imaging Performed: non   Protocol: standard Bruce - maximal  Max BP:  222/96  Max MPHR (bpm):  139 85% MPR (bpm):  163  MPHR obtained (bpm):  164 % MPHR obtained:  100%  Reached 85% MPHR (min:sec):  6:15 Total Exercise Time (min-sec):  8:01  Workload in METS:  10.0 Borg Scale: 13  Reason ETT Terminated:  desired heart rate attained    ST Segment Analysis At Rest: normal ST segments - no evidence of significant ST depression With Exercise: no evidence of significant ST depression  Other Information Arrhythmia:  Occasional PVCs Angina during ETT:  absent (0) Quality of ETT:  diagnostic  ETT Interpretation:  normal - no evidence of ischemia by ST analysis  Comments: Good exercise tolerance. No chest pain. Hypertensive BP response to exercise. No ST-T changes to suggest ischemia.   Recommendations: Follow up with Dr. Arvilla Meres as directed. Signed, Tereso Newcomer, PA-C  10:08 AM 07/21/2012

## 2012-07-24 ENCOUNTER — Ambulatory Visit (HOSPITAL_COMMUNITY)
Admission: RE | Admit: 2012-07-24 | Discharge: 2012-07-24 | Disposition: A | Discharge status: Home / Self Care | Payer: 59 | Source: Ambulatory Visit | Attending: Internal Medicine | Admitting: Internal Medicine

## 2012-07-24 ENCOUNTER — Encounter (HOSPITAL_COMMUNITY): Payer: Self-pay

## 2012-07-24 VITALS — BP 124/78 | HR 56 | Wt 197.0 lb

## 2012-07-24 DIAGNOSIS — R0989 Other specified symptoms and signs involving the circulatory and respiratory systems: Secondary | ICD-10-CM | POA: Insufficient documentation

## 2012-07-24 DIAGNOSIS — R0602 Shortness of breath: Secondary | ICD-10-CM

## 2012-07-24 DIAGNOSIS — R0609 Other forms of dyspnea: Secondary | ICD-10-CM | POA: Insufficient documentation

## 2012-07-24 NOTE — Progress Notes (Signed)
Patient ID: [name], male   DOB: [birth year], 57 y.o.   MRN: [id number]   Weight Range   Baseline proBNP    PCP: Dr Evlyn Kanner Pulmonologist: Dr Delton Coombes  HPI: 57 y/o male with h/o life threatening hyperthyroidism, PAF, diastolic heart failure,  and COPD/asthma.  Admitted in 2010 with HF in setting of severe hyperthyroidism and AF with RVR.  08/23/2008 RHC Right atrial pressure mean of 19 RV pressure 73/7 with an EDP of 20 PA pressure 70/22 with a mean of 43 PCWP 23 with a V-wave to 38.  Fick cardiac output 6.0 with an index of 3.4. PVR 3.3 Woods units.  Had repeat I-131 ablation on 09/17/2008. Maintained SR. Off tikosyn and coumadin 2010.   He returns for follow up on his chest pressure and exertional dyspnea. Feels ok at rest but with moderate activity gets winded and has to stop.  Since last visit has had echo and ETT.    Echo 1/14: EF 55-60% Normal RV. Normal diastolic parameters ETT: Walked 8:01 on Bruce max HR 164 bpm. HTNive response 222/96. No ST-T changes  Labs: BMET normal. CBC  WBC 3.4/Hgb 18.1 (HCT 49.3)/ PLT 103    ROS: All systems negative except as listed in HPI, PMH and Problem List.  Past Medical History  Diagnosis Date  . Hyperthyroidism     Current Outpatient Prescriptions  Medication Sig Dispense Refill  . budesonide-formoterol (SYMBICORT) 160-4.5 MCG/ACT inhaler Inhale 2 puffs into the lungs 2 (two) times daily.  1 Inhaler  3  . levalbuterol (XOPENEX HFA) 45 MCG/ACT inhaler Inhale 1-2 puffs into the lungs every 4 (four) hours as needed.      Marland Kitchen levothyroxine (SYNTHROID, LEVOTHROID) 75 MCG tablet Take 75 mcg by mouth daily.      . chlorpheniramine (CHLOR-TRIMETON) 4 MG tablet Take by mouth 2 (two) times daily as needed.      . loratadine (CLARITIN) 10 MG tablet Take 1 tablet (10 mg total) by mouth daily.  30 tablet  4     PHYSICAL EXAM: Filed Vitals:   07/24/12 1527  BP: 124/78  Pulse: 56  Weight: 197 lb (89.359 kg)  SpO2: 99%    Hall walk HR  60-70s Sat 97-98%  General:  Well appearing. No resp difficulty HEENT: normal Neck: supple. JVP flat. Carotids 2+ bilaterally; no bruits. No lymphadenopathy or thryomegaly appreciated. Cor: PMI normal. Regular rate & rhythm. No rubs, gallops or murmurs. Lungs: clear Abdomen: soft, nontender, nondistended. No hepatosplenomegaly. No bruits or masses. Good bowel sounds. Extremities: no cyanosis, clubbing, rash, edema Neuro: alert & orientedx3, cranial nerves grossly intact. Moves all 4 extremities w/o difficulty. Affect pleasant.   ECG: NSR 61 bpm. No ST-T wave abnormalities.     ASSESSMENT & PLAN:

## 2012-07-25 ENCOUNTER — Other Ambulatory Visit: Payer: Self-pay | Admitting: Gastroenterology

## 2012-07-25 DIAGNOSIS — B182 Chronic viral hepatitis C: Secondary | ICD-10-CM

## 2012-07-26 NOTE — Assessment & Plan Note (Signed)
Echo and stress test look very good. I do not think he needs and invasive evaluation at this point. I have cleared him to go the gym and resume his exercise program gradually. He does have prominent polycythemia on exam. I walked him today in clinic but no exertional hypoxemia. Will refer him to hematology for further work-up. May be worth checking a renin level. If heme work-up negative could consider repeat echo with bubble study looking for shunt but this is less likely as RA not prominent on echo. Will continue to follow.

## 2012-07-28 ENCOUNTER — Telehealth: Payer: Self-pay | Admitting: Oncology

## 2012-07-28 NOTE — Telephone Encounter (Signed)
C/D 07/28/12 for appt.08/15/12

## 2012-07-28 NOTE — Telephone Encounter (Signed)
LVOM for pt to return call.  °

## 2012-07-28 NOTE — Telephone Encounter (Signed)
S/W pt in re NP appt 02/28 @ 1:30 w/Dr. Clelia Croft.  Referring Dr. Gala Romney Dx-Abn CBC Welcome packet mailed.

## 2012-07-30 ENCOUNTER — Ambulatory Visit
Admission: RE | Admit: 2012-07-30 | Discharge: 2012-07-30 | Disposition: A | Discharge status: Home / Self Care | Payer: 59 | Source: Ambulatory Visit | Attending: Gastroenterology | Admitting: Gastroenterology

## 2012-07-30 DIAGNOSIS — B182 Chronic viral hepatitis C: Secondary | ICD-10-CM

## 2012-08-08 ENCOUNTER — Other Ambulatory Visit: Payer: Self-pay | Admitting: Oncology

## 2012-08-15 ENCOUNTER — Ambulatory Visit (HOSPITAL_BASED_OUTPATIENT_CLINIC_OR_DEPARTMENT_OTHER): Payer: 59

## 2012-08-15 ENCOUNTER — Other Ambulatory Visit (HOSPITAL_BASED_OUTPATIENT_CLINIC_OR_DEPARTMENT_OTHER): Payer: 59

## 2012-08-15 ENCOUNTER — Ambulatory Visit (HOSPITAL_BASED_OUTPATIENT_CLINIC_OR_DEPARTMENT_OTHER): Payer: 59 | Admitting: Oncology

## 2012-08-15 VITALS — BP 143/85 | HR 60 | Temp 97.4°F | Resp 20 | Ht 70.0 in | Wt 195.4 lb

## 2012-08-15 LAB — COMPREHENSIVE METABOLIC PANEL (CC13)
ALT: 21 U/L (ref 0–55)
CO2: 29 mEq/L (ref 22–29)
Sodium: 140 mEq/L (ref 136–145)
Total Bilirubin: 1.67 mg/dL — ABNORMAL HIGH (ref 0.20–1.20)
Total Protein: 6.7 g/dL (ref 6.4–8.3)

## 2012-08-15 LAB — CBC WITH DIFFERENTIAL/PLATELET
BASO%: 1 % (ref 0.0–2.0)
LYMPH%: 35.4 % (ref 14.0–49.0)
MCHC: 34.6 g/dL (ref 32.0–36.0)
MONO#: 0.4 10*3/uL (ref 0.1–0.9)
RBC: 5.34 10*6/uL (ref 4.20–5.82)
RDW: 13.5 % (ref 11.0–14.6)
WBC: 4.1 10*3/uL (ref 4.0–10.3)
lymph#: 1.5 10*3/uL (ref 0.9–3.3)

## 2012-08-15 LAB — CHCC SMEAR

## 2012-08-15 NOTE — Progress Notes (Signed)
Note dictated

## 2012-08-16 NOTE — Progress Notes (Signed)
CC:   [name]. [name], M.D. [name]. Bensimhon, MD  PRINCIPAL DIAGNOSIS:  This is a 57 year old gentleman with thrombocytopenia.  Differential diagnosis include autoimmune disorder versus reactive finding due to a hyperparathyroidism diagnosed in 2010.  HISTORY OF PRESENT ILLNESS:  [name] is a pleasant 57 year old gentleman who I saw for the first time back in January of 2010 for evaluation for thrombocytopenia and mild anemia and significant hyperthyroidism.  He had presented with atrial fibrillation due to thyrotoxicosis.  Initially his platelet counts have ranged between 40- 60,000 and had a bone marrow biopsy that was done on 08/31/2008 which showed normal cellular bone marrow with trilineage hematopoiesis.  It felt at that time that his thrombocytopenia was reactive and no really further intervention was needed.  The patient was supposed to follow up for his counts routinely.  However, he has not showed up since March of 2010.  In the interim he has been followed by [name] as well as [name] for his health issues and most recently he had a CBC that showed his white cell count 3.4, his hemoglobin was 18.1, platelet count of 103.  His blood counts have ranged since his last visit between 110 to 113,000 and his white cell counts have been relatively the normal size between 3.4 and 4.5.  Hemoglobin has normalized since 2010 and ranged between hemoglobin of 16 to 18.  Clinically [name] feels very well.  His thyroid disease is under excellent control.  He has had iodine radioablation, currently on thyroid supplement.  Seems to be his heart is in good function.  He has not started exercising but wishes to do so.  He has gained weight since the last time I saw him.  He had not reported any chest pain, had not reported any difficulty breathing, had not reported any exertional dyspnea, had not reported any hemoptysis.  REVIEW OF SYSTEMS:  Not reported any headaches,  blurry vision, double vision.  Not reported any motor or sensory neuropathy.  Not reported alteration in mental status.  Not reported any psychiatric issues, depression.  Not reported any fever, chills, sweats.  Not reported any cough, hemoptysis, hematemesis.  No nausea, vomiting.  No abdominal pain.  Rest of review of systems is unremarkable.  PHYSICAL EXAM:  General:  Alert, awake gentleman, appeared in no active distress.  Vital signs:  His blood pressure is 143/85, pulse 60, respirations 20, temperature 97.4, weight 195 pounds.  ECOG performance status is 0.  LABORATORY DATA:  Today reviewed with [name] and showed his hemoglobin is 17 which is within normal range.  His white cell count is 4.1 which is within normal range.  His platelet count is 99,000.  He had normal differential at this point.  His creatinine of 1.0, bilirubin of 1.6, normal liver function tests.  ASSESSMENT AND PLAN:  A 57 year old gentleman with the following issues: 1. Chronic thrombocytopenia.  His diagnosis dates back to at least     2010, his platelet counts around 40-60,000.  At that time his     workup did not reveal any evidence of an infiltrative bone marrow     disease.  He had a bone marrow biopsy at that time that was     unremarkable.  At that time I felt that was probably due to his     autoimmune disorder and maybe due to his medications.  At this time     his platelet counts really have been above 100,000 for  the last few     months.  He has not reported any bleeding complications.  Overall     his thrombocytopenia is mild and really no intervention is needed     from that standpoint. 2. Erythrocytosis.  He had a high hemoglobin of 18.1 but now it is     normal.  I really see no evidence to suggest that he has any anemia     or polycythemia.  From my standpoint really no hematological workup     is needed.  It is really clear to resume any exercises or workouts     he would like to  do. 3. I will be happy to see [name] in the future for consultation     but I have not set him up for any routine followup.    ______________________________ Benjiman Core, M.D. FNS/MEDQ  D:  08/15/2012  T:  08/16/2012  Job:  [id number]

## 2012-09-15 ENCOUNTER — Encounter (HOSPITAL_COMMUNITY): Payer: 59

## 2012-09-30 ENCOUNTER — Encounter (HOSPITAL_COMMUNITY): Payer: Self-pay | Admitting: Cardiology

## 2012-09-30 ENCOUNTER — Telehealth (HOSPITAL_COMMUNITY): Payer: Self-pay | Admitting: Cardiology

## 2012-09-30 NOTE — Telephone Encounter (Signed)
I have been unable to reach this patient by phone.  A letter is being sent to the last known home address.

## 2013-07-16 ENCOUNTER — Ambulatory Visit (HOSPITAL_COMMUNITY)
Admission: RE | Admit: 2013-07-16 | Discharge: 2013-07-16 | Disposition: A | Discharge status: Home / Self Care | Payer: 59 | Source: Ambulatory Visit | Attending: Internal Medicine | Admitting: Internal Medicine

## 2013-07-16 ENCOUNTER — Encounter (HOSPITAL_COMMUNITY): Payer: Self-pay

## 2013-07-16 VITALS — BP 146/88 | HR 58 | Wt 193.1 lb

## 2013-07-16 DIAGNOSIS — R002 Palpitations: Secondary | ICD-10-CM

## 2013-07-16 DIAGNOSIS — I4891 Unspecified atrial fibrillation: Secondary | ICD-10-CM | POA: Insufficient documentation

## 2013-07-16 DIAGNOSIS — I10 Essential (primary) hypertension: Secondary | ICD-10-CM | POA: Insufficient documentation

## 2013-07-16 MED ORDER — METOPROLOL TARTRATE 25 MG PO TABS: 25.0000 mg | ORAL_TABLET | ORAL | Status: DC | PRN

## 2013-07-16 NOTE — Patient Instructions (Signed)
Metoprolol 25 mg as needed for Palpitations  Your physician has recommended that you wear an event monitor. Event monitors are medical devices that record the heart's electrical activity. Doctors most often [place] these monitors to diagnose arrhythmias. Arrhythmias are problems with the speed or rhythm of the heartbeat. The monitor is a small, portable device. You can wear one while you do your normal daily activities. This is usually used to diagnose what is causing palpitations/syncope (passing out).

## 2013-07-16 NOTE — Progress Notes (Signed)
Patient ID: [name], male   DOB: 10-17-1955, 58 y.o.   MRN: [id number]   Weight Range   Baseline proBNP    PCP: Dr Forde Dandy Pulmonologist: Dr Lamonte Sakai  HPI: 58 y/o male with h/o life threatening hyperthyroidism, PAF, diastolic heart failure and COPD/asthma.  Admitted in 2010 with HF in setting of severe hyperthyroidism and AF with RVR.  08/23/2008 RHC Right atrial pressure mean of 19 RV pressure 73/7 with an EDP of 20 PA pressure 70/22 with a mean of 43 PCWP 23 with a V-wave to 38.  Fick cardiac output 6.0 with an index of 3.4. PVR 3.3 Woods units.  Had repeat I-131 ablation on 09/17/2008. Maintained SR. Off tikosyn and coumadin 2010.   Echo 1/14: EF 55-60% Normal RV. Normal diastolic parameters ETT 4/49: Walked 8:01 on Bruce max HR 164 bpm. HTNive response 222/96. No ST-T changes  Labs: BMET normal. CBC  WBC 3.4/Hgb 18.1 (HCT 49.3)/ PLT 103  Follow-up: Returns for work in visit. About 3 weeks ago had episode of tachypalpitations. Lasted 10 minutes. Felt lightheaded and had CP. Took vitals and HR was 178. Since that time no clear recurrent episodes but has felt funny at times. Saw Dr. Forde Dandy on Tuesday to check on thyroid but hasn't heard yet about values. Drinks 2 cups of coffee per day. Not exercising. No exertional symptoms. No snoring.   ROS: All systems negative except as listed in HPI, PMH and Problem List.  Past Medical History  Diagnosis Date  . Hyperthyroidism     Current Outpatient Prescriptions  Medication Sig Dispense Refill  . budesonide-formoterol (SYMBICORT) 160-4.5 MCG/ACT inhaler Inhale 2 puffs into the lungs 2 (two) times daily.  1 Inhaler  3  . levothyroxine (SYNTHROID, LEVOTHROID) 75 MCG tablet Take 75 mcg by mouth daily.      Marland Kitchen loratadine (CLARITIN) 10 MG tablet Take 1 tablet (10 mg total) by mouth daily.  30 tablet  4   No current facility-administered medications for this encounter.     PHYSICAL EXAM: Filed Vitals:   07/16/13 1521  BP: 146/88   Pulse: 58  Weight: 193 lb 1.9 oz (87.599 kg)  SpO2: 97%    General:  Well appearing. No resp difficulty HEENT: normal Neck: supple. JVP flat. Carotids 2+ bilaterally; no bruits. No lymphadenopathy or thryomegaly appreciated. Cor: PMI normal. Regular rate & rhythm. No rubs, gallops or murmurs. Lungs: clear Abdomen: soft, nontender, nondistended. No hepatosplenomegaly. No bruits or masses. Good bowel sounds. Extremities: no cyanosis, clubbing, rash, edema Neuro: alert & orientedx3, cranial nerves grossly intact. Moves all 4 extremities w/o difficulty. Affect pleasant.   ECG: Sinus brady. Normal axis and intervals   ASSESSMENT & PLAN: 1) Tachypalpitations 2) h/o AF in setting of hyperthyroidism 3) HTN  Will place event monitor to further evaluate. Given bradycardia will use metoprolol only prn for palpitations. He will minimize caffeine. We reviewed the use of vagal maneuvers to try and break episodes. Will need to f/u on thyroid panel drawn by Dr. Forde Dandy.  Daniel Bensimhon,MD 4:24 PM

## 2013-07-17 ENCOUNTER — Other Ambulatory Visit: Payer: Self-pay | Admitting: Emergency Medicine

## 2013-07-21 ENCOUNTER — Encounter: Payer: Self-pay | Admitting: *Deleted

## 2013-07-21 ENCOUNTER — Encounter (INDEPENDENT_AMBULATORY_CARE_PROVIDER_SITE_OTHER): Payer: 59

## 2013-07-21 DIAGNOSIS — R002 Palpitations: Secondary | ICD-10-CM

## 2013-07-21 NOTE — Progress Notes (Signed)
Patient ID: [name], male   DOB: Sep 03, 1955, 58 y.o.   MRN: [id number] E-Cardio verite 30 day cardiac event monitor applied to patient.

## 2013-08-07 ENCOUNTER — Telehealth: Payer: Self-pay | Admitting: Emergency Medicine

## 2013-08-07 MED ORDER — LEVALBUTEROL HCL 0.63 MG/3ML IN NEBU: 0.6300 mg | INHALATION_SOLUTION | RESPIRATORY_TRACT | Status: DC | PRN

## 2013-08-07 MED ORDER — AIRS DISPOSABLE NEBULIZER KIT: PACK | Status: DC

## 2013-08-07 NOTE — Telephone Encounter (Signed)
Pt is needing refill on Xopenex neb solution. He states it has been a long time since he has had this. Also needs neb tubing. I have sent in both items to his pharmacy.

## 2013-08-18 ENCOUNTER — Ambulatory Visit (INDEPENDENT_AMBULATORY_CARE_PROVIDER_SITE_OTHER): Payer: 59 | Admitting: Emergency Medicine

## 2013-08-18 ENCOUNTER — Encounter: Payer: Self-pay | Admitting: Emergency Medicine

## 2013-08-18 VITALS — BP 122/86 | HR 71 | Ht 70.0 in | Wt 198.0 lb

## 2013-08-18 DIAGNOSIS — J449 Chronic obstructive pulmonary disease, unspecified: Secondary | ICD-10-CM

## 2013-08-18 MED ORDER — BUDESONIDE-FORMOTEROL FUMARATE 160-4.5 MCG/ACT IN AERO: 2.0000 | INHALATION_SPRAY | Freq: Two times a day (BID) | RESPIRATORY_TRACT | Status: DC

## 2013-08-18 NOTE — Progress Notes (Signed)
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 Had repeat thyroid ablation (Dr Chalmers Cater), and since then A fib and diastolic dysfxn improved. Has been released by Dr Haroldine Laws with these improvements.   [name] 04/12/09 -- Continues to use Symbicort two times a day, not sure that it helps him but he uses it reliably. Uses xopenex occasionally, especially when he cuts the grass. rec wean off symbicort and did worse off so restart and since then no need for xopenex routinely.   August 31, 2009 Acute visit. Pt c/o chest congestion x 2wks. He also states that he has had fever/chills and aches. Pt also c/o prod cough with thick clear sputum. Misunderstood instructions and stopped symbicort at onset and changed to xopenex.   [name] 09/26/09 -- Was seen acutely in 3/11 as above in setting URI. Now feeling better after rx with prednisone. He is still having a globus sensation, no PND, minimal cough. Not sure whether the Symbicort is related to the UA irritation. Also notes that 6 months ago he noticed a red, sometimes dried rash on his R forearm. Not pigmented or brown. He was asked to take prilosec but has not done so.   [name] 06/27/10 -- follows up for asthma/COPD, allergies and congestion. He has been doing well, going to the gym biw. Breathing has been good, doesn't need SABA. Has 3 cats and a dog - bothering allergies, bothering breathing some. He is looking to get an air purifier, hopes to avoid going on allergy meds. No flares, no hospitalizations since last time.  [name] 01/24/12 -- COPD/asthma, allergies with cough. Has a hx high output heart failure from hyperthyroidism - resolved. Has done quite well since last time, but presents  today with allergic eyes, some cough, worsening dyspnea with conversation. No real wheeze. He has been on Symbicort, rare xopenex use.  Doesn't believe he is having PND. Lots of throat clearing.   [name] 08/18/13 -- COPD/asthma, allergies with cough.  He tells me he has been doing well. He was seen for palpitations in January '15, has worn an event monitor.  He had a flare recently in setting URI recently > cough, congestion. Took OTC symptom relief. He has been on Symbicort reliably. He uses xopenex rarely.   Filed Vitals:   08/18/13 1642  BP: 122/86  Pulse: 71  Height: 5\' 10"  (1.778 m)  Weight: 198 lb (89.812 kg)  SpO2: 98%   Gen: Pleasant, well-nourished, in no distress,  normal affect  ENT: No lesions,  mouth clear,  oropharynx clear, no postnasal drip  Neck: No JVD, no TMG, no carotid bruits  Lungs: No use of accessory muscles, no dullness to percussion, clear without rales or rhonchi  Cardiovascular: RRR, heart sounds normal, no murmur or gallops, no peripheral edema  Musculoskeletal: No deformities, no cyanosis or clubbing  Neuro: alert, non focal  Skin: Warm, no lesions or rashes   COPD Doing well. No flares, even when he had URI last month. He has generic xopenex now - I think this should be fine. Will continue symbicort, consider stopping in the future if he continues to do well.   Will continue symbicort and levalbuterol prn rov 1 year

## 2013-08-18 NOTE — Assessment & Plan Note (Addendum)
Doing well. No flares, even when he had URI last month. He has generic xopenex now - I think this should be fine. Will continue symbicort, consider stopping in the future if he continues to do well.   Will continue symbicort and levalbuterol prn rov 1 year

## 2013-08-18 NOTE — Patient Instructions (Signed)
Please continue your Symbicort twice a day Use levalbuterol 2 puffs as needed for shortness of breath Follow with Dr Lamonte Sakai in 12 months or sooner if you have any problems

## 2013-08-27 ENCOUNTER — Telehealth (HOSPITAL_COMMUNITY): Payer: Self-pay | Admitting: *Deleted

## 2013-08-27 NOTE — Telephone Encounter (Signed)
Called pt with monitor results per Dr Haroldine Laws, SR with no arrhythmia, pt aware

## 2013-10-06 ENCOUNTER — Encounter: Payer: Self-pay | Admitting: Internal Medicine

## 2014-08-06 ENCOUNTER — Telehealth: Payer: Self-pay | Admitting: Emergency Medicine

## 2014-08-06 MED ORDER — PREDNISONE 10 MG PO TABS: ORAL_TABLET | ORAL | Status: DC

## 2014-08-06 MED ORDER — DOXYCYCLINE HYCLATE 100 MG PO TABS: 100.0000 mg | ORAL_TABLET | Freq: Two times a day (BID) | ORAL | Status: DC

## 2014-08-06 NOTE — Telephone Encounter (Signed)
Called and spoke with pt and he is aware of RB recs.  meds have been sent to the [facility] and nothing further is needed.

## 2014-08-06 NOTE — Telephone Encounter (Signed)
Called and spoke to pt. Informed pt once RB responds we will call him back. Pt verbalized understanding.

## 2014-08-06 NOTE — Telephone Encounter (Signed)
Called and spoke with pt and he stated that he is having a cough---has been sick since Wednesday. He stated that when this started he was coughing up green sputum with blood in it, but this has since cleared.  He is now just coughing, chest feels tight and congested, but gets better after using the inhaler.  He stated that he is not having any chills, body aches or fever.  Pt is requesting that abx be called to his pharmacy.  RB please advise. Thanks  Allergies  Allergen Reactions  . Other Other (See Comments)    "Med for hypothyroidism. Killed white blood cells."--?Atenol  . Theophyllines     Current Outpatient Prescriptions on File Prior to Visit  Medication Sig Dispense Refill  . budesonide-formoterol (SYMBICORT) 160-4.5 MCG/ACT inhaler Inhale 2 puffs into the lungs 2 (two) times daily. 1 Inhaler 11  . levalbuterol (XOPENEX) 0.63 MG/3ML nebulizer solution Take 3 mLs (0.63 mg total) by nebulization every 4 (four) hours as needed for wheezing or shortness of breath. 360 mL 0  . levothyroxine (SYNTHROID, LEVOTHROID) 75 MCG tablet Take 75 mcg by mouth daily.    Marland Kitchen loratadine (CLARITIN) 10 MG tablet Take 1 tablet (10 mg total) by mouth daily. 30 tablet 4  . metoprolol tartrate (LOPRESSOR) 25 MG tablet Take 1 tablet (25 mg total) by mouth as needed. For palpitations 30 tablet 3  . Respiratory Therapy Supplies (AIRS DISPOSABLE NEBULIZER) KIT Use as directed 10 each 0   No current facility-administered medications on file prior to visit.

## 2014-08-06 NOTE — Telephone Encounter (Signed)
Please call in pred Take 40mg  daily for 3 days, then 30mg  daily for 3 days, then 20mg  daily for 3 days, then 10mg  daily for 3 days, then stop And doxy 100mg  bid x 7 days Have him call next week to let us know if he is improving

## 2014-08-06 NOTE — Telephone Encounter (Signed)
Pt calling back [phone] (M)

## 2014-10-11 ENCOUNTER — Telehealth: Payer: Self-pay | Admitting: Emergency Medicine

## 2014-10-11 ENCOUNTER — Other Ambulatory Visit: Payer: Self-pay | Admitting: Emergency Medicine

## 2014-10-11 MED ORDER — BUDESONIDE-FORMOTEROL FUMARATE 160-4.5 MCG/ACT IN AERO: INHALATION_SPRAY | RESPIRATORY_TRACT | Status: DC

## 2014-10-11 NOTE — Telephone Encounter (Signed)
Rx has been sent in. Pt is aware. Nothing further was needed. 

## 2014-10-26 ENCOUNTER — Telehealth: Payer: Self-pay | Admitting: Emergency Medicine

## 2014-10-26 NOTE — Telephone Encounter (Signed)
lmtcb X1 

## 2014-10-26 NOTE — Telephone Encounter (Signed)
Pt returning call.[name] ° °

## 2014-10-26 NOTE — Telephone Encounter (Signed)
ATC pt. VM full. WCB.  

## 2014-10-27 NOTE — Telephone Encounter (Signed)
Attempted to call, voicemail is full. Will try back.

## 2014-10-28 MED ORDER — BUDESONIDE-FORMOTEROL FUMARATE 160-4.5 MCG/ACT IN AERO: INHALATION_SPRAY | RESPIRATORY_TRACT | Status: DC

## 2014-10-28 NOTE — Telephone Encounter (Signed)
I called spoke with pt. He needs refill on symbicort. He left # to call [phone] to call this into. I called and it is for [facility]. Rx sent in. Nothing further needed

## 2014-11-22 ENCOUNTER — Ambulatory Visit: Payer: 59 | Admitting: Emergency Medicine

## 2015-03-01 ENCOUNTER — Encounter: Payer: Self-pay | Admitting: Emergency Medicine

## 2015-03-01 ENCOUNTER — Ambulatory Visit (INDEPENDENT_AMBULATORY_CARE_PROVIDER_SITE_OTHER): Payer: Federal, State, Local not specified - PPO | Admitting: Emergency Medicine

## 2015-03-01 VITALS — BP 130/74 | HR 61 | Ht 70.0 in | Wt 187.0 lb

## 2015-03-01 DIAGNOSIS — J449 Chronic obstructive pulmonary disease, unspecified: Secondary | ICD-10-CM

## 2015-03-01 MED ORDER — LEVALBUTEROL HCL 0.63 MG/3ML IN NEBU: 0.6300 mg | INHALATION_SOLUTION | RESPIRATORY_TRACT | Status: DC | PRN

## 2015-03-01 MED ORDER — BUDESONIDE-FORMOTEROL FUMARATE 160-4.5 MCG/ACT IN AERO: INHALATION_SPRAY | RESPIRATORY_TRACT | Status: DC

## 2015-03-01 NOTE — Patient Instructions (Addendum)
Please continue your symbicort today. Remember to rinse and gargle after using                    Use Xopenex 2 puffs as needed for shortness of breath He may want to consider restarting her loratadine 10 mg daily while you are living in [place]. You very well may be able to stop this medicine altogether after relocating         Remember to get the flu shot this fall Contact our office when we need to transfer your records to your new physician after relocating Follow with Dr Lamonte Sakai in 6 months if you're still living in [place]

## 2015-03-01 NOTE — Progress Notes (Signed)
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 Had repeat thyroid ablation (Dr Chalmers Cater), and since then A fib and diastolic dysfxn improved. Has been released by Dr Haroldine Laws with these improvements.   [name] 04/12/09 -- Continues to use Symbicort two times a day, not sure that it helps him but he uses it reliably. Uses xopenex occasionally, especially when he cuts the grass. rec wean off symbicort and did worse off so restart and since then no need for xopenex routinely.   August 31, 2009 Acute visit. Pt c/o chest congestion x 2wks. He also states that he has had fever/chills and aches. Pt also c/o prod cough with thick clear sputum. Misunderstood instructions and stopped symbicort at onset and changed to xopenex.   [name] 09/26/09 -- Was seen acutely in 3/11 as above in setting URI. Now feeling better after rx with prednisone. He is still having a globus sensation, no PND, minimal cough. Not sure whether the Symbicort is related to the UA irritation. Also notes that 6 months ago he noticed a red, sometimes dried rash on his R forearm. Not pigmented or brown. He was asked to take prilosec but has not done so.   [name] 06/27/10 -- follows up for asthma/COPD, allergies and congestion. He has been doing well, going to the gym biw. Breathing has been good, doesn't need SABA. Has 3 cats and a dog - bothering allergies, bothering breathing some. He is looking to get an air purifier, hopes to avoid going on allergy meds. No flares, no hospitalizations since last time.  [name] 01/24/12 -- COPD/asthma, allergies with cough. Has a hx high output heart failure from hyperthyroidism - resolved. Has done quite well since last time, but presents  today with allergic eyes, some cough, worsening dyspnea with conversation. No real wheeze. He has been on Symbicort, rare xopenex use.  Doesn't believe he is having PND. Lots of throat clearing.   [name] 08/18/13 -- COPD/asthma, allergies with cough.  He tells me he has been doing well. He was seen for palpitations in January '15, has worn an event monitor.  He had a flare recently in setting URI recently > cough, congestion. Took OTC symptom relief. He has been on Symbicort reliably. He uses xopenex rarely.   [name] 03/01/15 -- follow-up visit for COPD, allergic rhinitis, chronic cough. He had a recent URI and some increased cough but not a full AE-COPD. He was able to stop his loratadine when he relocated to [place]. He has been doing well. He is on Symbicort bid, doesn't;t have any xopenex right now.   Filed Vitals:   03/01/15 1435  BP: 130/74  Pulse: 61  Height: 5\' 10"  (1.778 m)  Weight: 187 lb (84.823 kg)  SpO2: 94%   Gen: Pleasant, well-nourished, in no distress,  normal affect  ENT: No lesions,  mouth clear,  oropharynx clear, no postnasal drip  Neck: No JVD, no TMG, no carotid bruits  Lungs: No use of accessory muscles, clear without rales or rhonchi, clear during a normal respiration but significant wheeze on forced expiration  Cardiovascular: RRR, heart sounds normal, no murmur or gallops, no peripheral edema  Musculoskeletal: No deformities, no cyanosis or clubbing  Neuro: alert, non focal  Skin: Warm, no  lesions or rashes   COPD Please continue your symbicort today. Remember to rinse and gargle after using                    Use Xopenex 2 puffs as needed for shortness of breath He may want to consider restarting her loratadine 10 mg daily while you are living in [place]. You very well may be able to stop this medicine altogether after relocating         Remember to get the flu shot this fall Contact our office when we need to transfer your records to your new  physician after relocating Follow with Dr Lamonte Sakai in 6 months if you're still living in [place]

## 2015-03-01 NOTE — Assessment & Plan Note (Signed)
Please continue your symbicort today. Remember to rinse and gargle after using                    Use Xopenex 2 puffs as needed for shortness of breath He may want to consider restarting her loratadine 10 mg daily while you are living in [place]. You very well may be able to stop this medicine altogether after relocating         Remember to get the flu shot this fall Contact our office when we need to transfer your records to your new physician after relocating Follow with Dr Lamonte Sakai in 6 months if you're still living in [place]

## 2015-05-20 ENCOUNTER — Other Ambulatory Visit: Payer: Self-pay | Admitting: Emergency Medicine

## 2015-09-28 ENCOUNTER — Telehealth: Payer: Self-pay | Admitting: Emergency Medicine

## 2015-09-28 NOTE — Telephone Encounter (Signed)
Spoke with pt. He is commuting between [place] and [place] has been scheduled with RB on 12/06/15 at 12pm. Rx will be called in. Nothing further was needed.

## 2015-09-28 NOTE — Telephone Encounter (Signed)
AVS instructions from 02/2015: Patient Instructions     Please continue your symbicort today. Remember to rinse and gargle after using   Use Xopenex 2 puffs as needed for shortness of breath He may want to consider restarting her loratadine 10 mg daily while you are living in [place]. You very well may be able to stop this medicine altogether after relocating  Remember to get the flu shot this fall Contact our office when we need to transfer your records to your new physician after relocating Follow with Dr Lamonte Sakai in 6 months if you're still living in [place]   lmtcb x1 for pt. 30 day supply can be sent in. If he is not living in [place] anymore, he will need to establish with a new pulmonologist.

## 2015-09-28 NOTE — Telephone Encounter (Signed)
Patient returned call, CB [phone]

## 2015-10-27 HISTORY — PX: COLONOSCOPY: SHX174

## 2015-12-06 ENCOUNTER — Ambulatory Visit: Payer: Federal, State, Local not specified - PPO | Admitting: Emergency Medicine

## 2016-08-07 ENCOUNTER — Telehealth: Payer: Self-pay | Admitting: Emergency Medicine

## 2016-08-07 NOTE — Telephone Encounter (Signed)
Called and spoke with pt and he stated that his doctor (PCP) there in [place], [place] advised him that his doctor will be out for about 2 months.  He is requesting that [facility] a referral to get him set up with a pulmonary doctor--he stated that his doctor's office there is giving him the run around and he will have to wait an extended amount of time to get set up with pulmonary.  RB please advise. Thanks

## 2016-08-08 NOTE — Telephone Encounter (Signed)
Patient returned call, CB is [phone].

## 2016-08-08 NOTE — Telephone Encounter (Signed)
lmomtcb x1 

## 2016-08-08 NOTE — Telephone Encounter (Signed)
Let him know that I don't know anyone in [place], but that if he has a pulmonary group or MD there that he wants to go to, then I will make the referral. He just needs to find one he wants to see, then give [place] the name and contact info and I will refer.

## 2016-08-08 NOTE — Telephone Encounter (Signed)
Spoke with pt, states his PCP has already referred him to a pulmonologist.  Nothing further needed.

## 2016-08-24 ENCOUNTER — Ambulatory Visit: Payer: Federal, State, Local not specified - PPO | Admitting: Pulmonary Disease

## 2016-08-31 ENCOUNTER — Ambulatory Visit (INDEPENDENT_AMBULATORY_CARE_PROVIDER_SITE_OTHER): Payer: Federal, State, Local not specified - PPO | Admitting: Adult Health

## 2016-08-31 ENCOUNTER — Encounter: Payer: Self-pay | Admitting: Adult Health

## 2016-08-31 DIAGNOSIS — I5032 Chronic diastolic (congestive) heart failure: Secondary | ICD-10-CM | POA: Diagnosis not present

## 2016-08-31 DIAGNOSIS — J441 Chronic obstructive pulmonary disease with (acute) exacerbation: Secondary | ICD-10-CM

## 2016-08-31 MED ORDER — PREDNISONE 10 MG PO TABS: ORAL_TABLET | ORAL | 0 refills | Status: DC

## 2016-08-31 MED ORDER — AMOXICILLIN-POT CLAVULANATE 875-125 MG PO TABS: 1.0000 | ORAL_TABLET | Freq: Two times a day (BID) | ORAL | 0 refills | Status: AC

## 2016-08-31 MED ORDER — GLYCOPYRROLATE 15.6 MCG IN CAPS: 1.0000 | ORAL_CAPSULE | Freq: Two times a day (BID) | RESPIRATORY_TRACT | 5 refills | Status: DC

## 2016-08-31 MED ORDER — GLYCOPYRROLATE 15.6 MCG IN CAPS: 1.0000 | ORAL_CAPSULE | Freq: Two times a day (BID) | RESPIRATORY_TRACT | 0 refills | Status: AC

## 2016-08-31 MED FILL — predniSONE 10 MG TABS: 10 | 8 days supply | Qty: 20 | Fill #0

## 2016-08-31 MED FILL — AMOX-CLAV 875-125 MG TABLET: 875-125 | 7 days supply | Qty: 14 | Fill #0

## 2016-08-31 NOTE — Progress Notes (Signed)
'@Patient'  ID: [name], male    DOB: 1955/09/30, 61 y.o.   MRN: [id number]  Chief Complaint  Patient presents with  . Acute Visit    COPD     Referring provider: Reynold Bowen, MD  HPI: 61 yo former smoker followed for GOLD III COPD /AB  Hx of substance abuse  PMH of High output CHF /Pulmonary HTN   TEST  PFT 2011 FEV1 42, ratio 43, ++ BD response    08/31/2016 Acute OV : COPD  Pt presents for an acute office visit. Complains of 2 months ago noticed increased DOE with notable worsening 3 weeks ago, with cough and wheezing . Went to PCP in [place] , dx w/ URI . No tx. Then went back to PCP due to persistent sx  Of cough , wheezing , doe, . Then was treated with 5 days of prednisone 52m . That seemed to help with ability of taking in deep breath. Helped briefly but sx came right back . Went to ER  b/c dyspnea got worse. He [facility]bs. [facility] CX[facility] , BNP, D Dimer , Troponin and BNP . BC were neg. LA was nml. H/H was nml .   Last seen in office in 2016 .  [place] forth via car every couple of months.   Does not exercise for last year, but work fulltime. Can do normal activities without dyspnea utnil 3 months.  Now has trouble walking across parking lot with having to rest.  Cough is getting worse with thick mucus , clear, white and yellow.    Allergies  Allergen Reactions  . Other Other (See Comments)    "Med for hypothyroidism. Killed white blood cells."--?Atenol  . Theophyllines      There is no immunization history on file for this patient.  Past Medical History:  Diagnosis Date  . Hyperthyroidism     Tobacco History: History  Smoking Status  . Former Smoker  . Packs/day: 1.00  . Years: 20.00  . Types: Cigarettes  . Quit date: 06/18/1986  Smokeless Tobacco  . Never Used   Counseling given: Not Answered   Outpatient Encounter Prescriptions  as of 08/31/2016  Medication Sig  . amoxicillin-clavulanate (AUGMENTIN) 875-125 MG tablet Take 1 tablet by mouth 2 (two) times daily.  . budesonide-formoterol (SYMBICORT) 160-4.5 MCG/ACT inhaler INHALE 2 PUFFS INTO THE LUNGS 2 TIMES DAILY.  .Marland Kitchenlevalbuterol (XOPENEX) 0.63 MG/3ML nebulizer solution Take 3 mLs (0.63 mg total) by nebulization every 4 (four) hours as needed for wheezing or shortness of breath.  . Levothyroxine Sodium 100 MCG CAPS Take 100 mcg by mouth daily.   . predniSONE (DELTASONE) 10 MG tablet 4 tabs for 2 days, then 3 tabs for 2 days, 2 tabs for 2 days, then 1 tab for 2 days, then stop  . Respiratory Therapy Supplies (AIRS DISPOSABLE NEBULIZER) KIT Use as directed  . SYMBICORT 160-4.5 MCG/ACT inhaler USE 2 INHALATIONS ORALLY   TWICE DAILY   No facility-administered encounter medications on file as of 08/31/2016.      Review of Systems  Constitutional:   No  weight loss, night sweats,  Fevers, chills,  +fatigue, or  lassitude.  HEENT:   No headaches,  Difficulty swallowing,  Tooth/dental problems, or  Sore throat,                No sneezing, itching, ear  ache, + nasal congestion, post nasal drip,   CV:  No chest pain,  Orthopnea, PND, swelling in lower extremities, anasarca, dizziness, palpitations, syncope.   GI  No heartburn, indigestion, abdominal pain, nausea, vomiting, diarrhea, change in bowel habits, loss of appetite, bloody stools.   Resp:  .  No chest wall deformity  Skin: no rash or lesions.  GU: no dysuria, change in color of urine, no urgency or frequency.  No flank pain, no hematuria   MS:  No joint pain or swelling.  No decreased range of motion.  No back pain.    Physical Exam  BP 114/72 (BP Location: Left Arm, Cuff Size: Normal)   Pulse 63   Ht '5\' 10"'  (1.778 m)   Wt 189 lb (85.7 kg)   SpO2 96%   BMI 27.12 kg/m   GEN: A/Ox3; pleasant , NAD    HEENT:  [place]/AT,  EACs-clear, TMs-wnl, NOSE-clear, THROAT-clear, no lesions, no postnasal drip or  exudate noted.   NECK:  Supple w/ fair ROM; no JVD; normal carotid impulses w/o bruits; no thyromegaly or nodules palpated; no lymphadenopathy.    RESP  Few rhonchi   no accessory muscle use, no dullness to percussion  CARD:  RRR, no m/r/g, no peripheral edema, pulses intact, no cyanosis or clubbing.  GI:   Soft & nt; nml bowel sounds; no organomegaly or masses detected.   Musco: Warm bil, no deformities or joint swelling noted.   Neuro: alert, no focal deficits noted.    Skin: Warm, no lesions or rashes  Imaging: No results found.   Assessment & Plan:   COPD exacerbation ([place]) Slow to resolve exacerbation with AB  ER w/up unrevealing , records reviwed   Plan  Patient Instructions  Augmentin 889m Twice daily  For 7 days , take w/ food .  Mucinex DM . Twice daily  As needed  Cough/congestion  Prednisone taper over next week.  Add Seebri 1 puff Twice daily  , rinse after use.  Establish as discussed with pulmonary on PA .  Follow up with our office As needed   Please contact office for sooner follow up if symptoms do not improve or worsen or seek emergency care      DFultoncompensated.  Cont on curretn regimen      TRexene Edison NP 08/31/2016

## 2016-08-31 NOTE — Progress Notes (Signed)
Patient seen in the office today and instructed on use of Seebri.  Patient expressed understanding and demonstrated technique.  Parke Poisson, CMA 08/31/16

## 2016-08-31 NOTE — Assessment & Plan Note (Signed)
Slow to resolve exacerbation with AB  ER w/up unrevealing , records reviwed   Plan  Patient Instructions  Augmentin 875mg  Twice daily  For 7 days , take w/ food .  Mucinex DM . Twice daily  As needed  Cough/congestion  Prednisone taper over next week.  Add Seebri 1 puff Twice daily  , rinse after use.  Establish as discussed with pulmonary on PA .  Follow up with our office As needed   Please contact office for sooner follow up if symptoms do not improve or worsen or seek emergency care

## 2016-08-31 NOTE — Patient Instructions (Addendum)
Augmentin 875mg  Twice daily  For 7 days , take w/ food .  Mucinex DM . Twice daily  As needed  Cough/congestion  Prednisone taper over next week.  Add Seebri 1 puff Twice daily  , rinse after use.  Establish as discussed with pulmonary on PA .  Follow up with our office As needed   Please contact office for sooner follow up if symptoms do not improve or worsen or seek emergency care

## 2016-08-31 NOTE — Assessment & Plan Note (Signed)
Appears compensated.  Cont on curretn regimen

## 2016-08-31 NOTE — Addendum Note (Signed)
Addended by: Parke Poisson E on: 08/31/2016 02:03 PM   Modules accepted: Orders

## 2016-09-11 ENCOUNTER — Telehealth: Payer: Self-pay | Admitting: Adult Health

## 2016-09-11 MED ORDER — GLYCOPYRROLATE 15.6 MCG IN CAPS: 1.0000 | ORAL_CAPSULE | Freq: Two times a day (BID) | RESPIRATORY_TRACT | 3 refills | Status: DC

## 2016-09-11 NOTE — Telephone Encounter (Signed)
Spoke with pt, requesting a 90 day supply rx to be sent to mail order pharmacy.  This has been sent to pharmacy.  Nothing further needed.

## 2017-06-08 ENCOUNTER — Emergency Department (HOSPITAL_BASED_OUTPATIENT_CLINIC_OR_DEPARTMENT_OTHER): Payer: BC Managed Care – PPO

## 2017-06-08 ENCOUNTER — Other Ambulatory Visit: Payer: Self-pay

## 2017-06-08 ENCOUNTER — Emergency Department (HOSPITAL_BASED_OUTPATIENT_CLINIC_OR_DEPARTMENT_OTHER)
Admission: EM | Admit: 2017-06-08 | Discharge: 2017-06-08 | Disposition: A | Discharge status: Home / Self Care | Payer: BC Managed Care – PPO | Attending: Emergency Medicine | Admitting: Emergency Medicine

## 2017-06-08 ENCOUNTER — Encounter (HOSPITAL_BASED_OUTPATIENT_CLINIC_OR_DEPARTMENT_OTHER): Payer: Self-pay | Admitting: Emergency Medicine

## 2017-06-08 DIAGNOSIS — I5032 Chronic diastolic (congestive) heart failure: Secondary | ICD-10-CM | POA: Insufficient documentation

## 2017-06-08 DIAGNOSIS — I110 Hypertensive heart disease with heart failure: Secondary | ICD-10-CM | POA: Diagnosis not present

## 2017-06-08 DIAGNOSIS — M778 Other enthesopathies, not elsewhere classified: Secondary | ICD-10-CM | POA: Diagnosis not present

## 2017-06-08 DIAGNOSIS — Z79899 Other long term (current) drug therapy: Secondary | ICD-10-CM | POA: Insufficient documentation

## 2017-06-08 DIAGNOSIS — Z87891 Personal history of nicotine dependence: Secondary | ICD-10-CM | POA: Diagnosis not present

## 2017-06-08 DIAGNOSIS — J449 Chronic obstructive pulmonary disease, unspecified: Secondary | ICD-10-CM | POA: Insufficient documentation

## 2017-06-08 DIAGNOSIS — M25532 Pain in left wrist: Secondary | ICD-10-CM | POA: Diagnosis present

## 2017-06-08 HISTORY — DX: Thrombocytopenia, unspecified: D69.6

## 2017-06-08 HISTORY — DX: Chronic viral hepatitis C: B18.2

## 2017-06-08 HISTORY — DX: Chronic obstructive pulmonary disease, unspecified: J44.9

## 2017-06-08 HISTORY — DX: Heart failure, unspecified: I50.9

## 2017-06-08 MED ORDER — KETOROLAC TROMETHAMINE 60 MG/2ML IM SOLN
30.0000 mg | Freq: Once | INTRAMUSCULAR | Status: AC
  Administered 2017-06-08: 30 mg via INTRAMUSCULAR

## 2017-06-08 MED ORDER — DICLOFENAC SODIUM ER 100 MG PO TB24: 100.0000 mg | ORAL_TABLET | Freq: Every day | ORAL | 0 refills | Status: DC

## 2017-06-08 MED ORDER — KETOROLAC TROMETHAMINE 30 MG/ML IJ SOLN
INTRAMUSCULAR | Status: AC
  Filled 2017-06-08: qty 1

## 2017-06-08 NOTE — ED Notes (Signed)
ED Provider at bedside. 

## 2017-06-08 NOTE — ED Triage Notes (Addendum)
PT presents with c/o left hand pain and swelling for 3 weeks and now has pain up his left arm and left shoulder for 1 week and then to his back . Pt states he only has the pain with movement.  PT states earlier today he felt like someone was sitting on his chest but not now. Pt also reports shortness of breath with activity in the past 2 days. No chest pain tightness or shortness of breath at this time.

## 2017-06-08 NOTE — ED Provider Notes (Signed)
[facility] EMERGENCY DEPARTMENT Provider Note   CSN: [id number] Arrival date & time: 06/08/17  0128     History   Chief Complaint Chief Complaint  Patient presents with  . Back Pain  . Hand Pain    HPI [name] is a 61 y.o. male.  The history is provided by the patient.  Hand Pain  This is a new problem. The current episode started more than 1 week ago (3 weeks). The problem occurs constantly. The problem has not changed since onset.Pertinent negatives include no chest pain, no abdominal pain, no headaches and no shortness of breath. Nothing aggravates the symptoms. Nothing relieves the symptoms. Treatments tried: a dose of ibuprofen  The treatment provided no relief.    Past Medical History:  Diagnosis Date  . CHF (congestive heart failure) ([place])   . Chronic hepatitis C ([facility])   . COPD (chronic obstructive pulmonary disease) ([place])   . Hyperthyroidism   . Thrombocytopenia [facility])     Patient Active Problem List   Diagnosis Date Noted  . DOE (dyspnea on exertion) 07/02/2012  . Acute sinusitis 04/22/2012  . Allergic rhinitis due to other allergen 01/24/2012  . CHEST PAIN 03/03/2010  . HOARSENESS 09/26/2009  . HYPERTENSION, BENIGN 04/05/2009  . PANCYTOPENIA 01/05/2009  . 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  . ATRIAL FIBRILLATION 09/10/2008  . DIASTOLIC HEART FAILURE, CHRONIC 09/10/2008  . DIARRHEA 09/10/2008  . ASTHMA, ACUTE 08/04/2007  . COPD exacerbation ([place]) 08/04/2007    History reviewed. No pertinent surgical history.     Home Medications    Prior to Admission medications   Medication Sig Start Date End Date Taking? Authorizing Provider  budesonide-formoterol (SYMBICORT) 160-4.5 MCG/ACT inhaler INHALE 2 PUFFS INTO THE LUNGS 2 TIMES DAILY. 03/01/15   Collene Gobble, MD  Diclofenac Sodium CR (VOLTAREN-XR) 100 MG 24 hr tablet Take 1 tablet (100 mg total) by mouth daily. 06/08/17   Maylynn Orzechowski, MD  Glycopyrrolate (SEEBRI NEOHALER) 15.6 MCG CAPS  Place 1 capsule into inhaler and inhale 2 (two) times daily. 09/11/16   Collene Gobble, MD  levalbuterol Penne Lash) 0.63 MG/3ML nebulizer solution Take 3 mLs (0.63 mg total) by nebulization every 4 (four) hours as needed for wheezing or shortness of breath. 03/01/15   Collene Gobble, MD  Levothyroxine Sodium 100 MCG CAPS Take 100 mcg by mouth daily.     [provider]  predniSONE (DELTASONE) 10 MG tablet 4 tabs for 2 days, then 3 tabs for 2 days, 2 tabs for 2 days, then 1 tab for 2 days, then stop 08/31/16   Melvenia Needles, NP  Respiratory Therapy Supplies (AIRS DISPOSABLE NEBULIZER) KIT Use as directed 08/07/13   Collene Gobble, MD  SYMBICORT 160-4.5 MCG/ACT inhaler USE 2 INHALATIONS ORALLY   TWICE DAILY 05/20/15   Collene Gobble, MD    Family History No family history on file.  Social History Social History   Tobacco Use  . Smoking status: Former Smoker    Packs/day: 1.00    Years: 20.00    Pack years: 20.00    Types: Cigarettes    Last attempt to quit: 06/18/1986    Years since quitting: 30.9  . Smokeless tobacco: Never Used  Substance Use Topics  . Alcohol use: No  . Drug use: No     Allergies   Other and Theophyllines   Review of Systems Review of Systems  Constitutional: Negative for diaphoresis.  Respiratory: Negative for cough, chest tightness and shortness of breath.  Cardiovascular: Negative for chest pain, palpitations and leg swelling.  Gastrointestinal: Negative for abdominal pain.  Musculoskeletal: Positive for arthralgias. Negative for neck pain.  Skin: Negative for wound.  Neurological: Negative for weakness, numbness and headaches.  All other systems reviewed and are negative.    Physical Exam Updated Vital Signs BP 121/73 (BP Location: Right Arm)   Pulse 61   Temp (!) 97.5 F (36.4 C) (Oral)   Resp 18   SpO2 100%   Physical Exam  Constitutional: He is oriented to person, place, and time. He appears well-developed and well-nourished.  No distress.  HENT:  Head: Normocephalic and atraumatic.  Mouth/Throat: No oropharyngeal exudate.  Eyes: Conjunctivae are normal. Pupils are equal, round, and reactive to light.  Neck: Normal range of motion. Neck supple. No JVD present.  Cardiovascular: Normal rate, regular rhythm, normal heart sounds and intact distal pulses.  Pulmonary/Chest: Effort normal and breath sounds normal. No stridor.  Abdominal: Soft. Bowel sounds are normal. There is no tenderness. There is no guarding.  Musculoskeletal: Normal range of motion.       Left elbow: Normal.       Left wrist: He exhibits tenderness. He exhibits normal range of motion, no bony tenderness, no effusion, no crepitus, no deformity and no laceration.       Left forearm: Normal.       Left hand: Normal. He exhibits normal capillary refill. Normal sensation noted. Normal strength noted.  Positive Finkelstein test, negative snuff box tenderness of the left wrist.    Lymphadenopathy:    He has no cervical adenopathy.  Neurological: He is alert and oriented to person, place, and time. He displays normal reflexes. He exhibits normal muscle tone.  Skin: Skin is warm and dry. Capillary refill takes less than 2 seconds. No erythema.     ED Treatments / Results  Labs (all labs ordered are listed, but only abnormal results are displayed) Labs Reviewed - No data to display  EKG  EKG Interpretation None       Radiology Dg Forearm Left  Result Date: 06/08/2017 CLINICAL DATA:  Left forearm pain for 3 weeks without known injury. EXAM: LEFT FOREARM - 2 VIEW COMPARISON:  None. FINDINGS: There is no evidence of fracture or other focal bone lesions. Soft tissues are unremarkable. IMPRESSION: Negative. Electronically Signed   By: Ulyses Jarred M.D.   On: 06/08/2017 04:34   Dg Wrist Complete Left  Result Date: 06/08/2017 CLINICAL DATA:  Left wrist pain for 3 weeks without known injury. EXAM: LEFT WRIST - COMPLETE 3+ VIEW COMPARISON:  None.  FINDINGS: There is no evidence of fracture or dislocation. There is no evidence of arthropathy or other focal bone abnormality. Soft tissues are unremarkable. IMPRESSION: Negative. Electronically Signed   By: Ulyses Jarred M.D.   On: 06/08/2017 04:33    Procedures Procedures (including critical care time)  Medications Ordered in ED Medications  ketorolac (TORADOL) injection 30 mg (30 mg Intramuscular Given 06/08/17 0335)       Final Clinical Impressions(s) / ED Diagnoses  Tendinitis, [place]:  NSAIDs and follow up with sports medicine  Return for fevers > 100.4 unrelieved by medication, weakness or numbness, stiff neck, intractable vomiting, or diarrhea, abdominal pain, Inability to tolerate liquids or food, cough, altered mental status or any concerns. No signs of systemic illness or infection. The patient is nontoxic-appearing on exam and vital signs are within normal limits.    I have reviewed the triage vital signs and the  nursing notes. Pertinent labs &imaging results that were available during my care of the patient were reviewed by me and considered in my medical decision making (see chart for details).  After history, exam, and medical workup I feel the patient has been appropriately medically screened and is safe for discharge home. Pertinent diagnoses were discussed with the patient. Patient was given return precautions    Final diagnoses:  Tendinitis of left wrist    ED Discharge Orders        Ordered    Diclofenac Sodium CR (VOLTAREN-XR) 100 MG 24 hr tablet  Daily     06/08/17 0448       Aroldo Galli, MD 06/08/17 1224

## 2017-06-24 ENCOUNTER — Telehealth: Payer: Self-pay | Admitting: Emergency Medicine

## 2017-06-24 MED ORDER — PREDNISONE 10 MG PO TABS: ORAL_TABLET | ORAL | 0 refills | Status: DC

## 2017-06-24 NOTE — Telephone Encounter (Signed)
Called and spoke with pt's spouse, Rollene Fare. Rollene Fare is aware of below recommendations and voiced her understanding.  Rx for prednisone has been sent to preferred pharmacy.  Rollene Fare requested an apt for next week to discuss pt's meds, as he is only having temporary relief in sx with Seebri and Symbicort.  Apt has been scheduled with TP on 07/02/17. Nothing further is needed.

## 2017-06-24 NOTE — Telephone Encounter (Signed)
It sounds like he could possibly need to be flu tested - If we don;t have appts here then he may need Urgent care to eval.   In meantime, Ok to call pred taper > Take 40mg  daily for 3 days, then 30mg  daily for 3 days, then 20mg  daily for 3 days, then 10mg  daily for 3 days, then stop

## 2017-06-24 NOTE — Telephone Encounter (Signed)
Spoke with pt's spouse (dpr on file), states pt c/o increased wheezing, dyspnea, prod cough with unknown color, nonprod cough, chest soreness, neck soreness X3-4 days.   Denies fever, mucus production, body aches.   Taking advil, alka-seltzer to help with s/s.  Pt had requested appt but nothing available in clinic this week.  Requesting recs.    Pt uses [facility] on [address].   RB please advise.  Thanks!

## 2017-07-02 ENCOUNTER — Ambulatory Visit (INDEPENDENT_AMBULATORY_CARE_PROVIDER_SITE_OTHER)
Admission: RE | Admit: 2017-07-02 | Discharge: 2017-07-02 | Disposition: A | Discharge status: Home / Self Care | Payer: BC Managed Care – PPO | Source: Ambulatory Visit | Attending: Adult Health | Admitting: Adult Health

## 2017-07-02 ENCOUNTER — Ambulatory Visit: Payer: BC Managed Care – PPO | Admitting: Adult Health

## 2017-07-02 ENCOUNTER — Encounter: Payer: Self-pay | Admitting: Adult Health

## 2017-07-02 VITALS — BP 130/82 | HR 60 | Ht 70.0 in | Wt 197.0 lb

## 2017-07-02 DIAGNOSIS — J441 Chronic obstructive pulmonary disease with (acute) exacerbation: Secondary | ICD-10-CM

## 2017-07-02 NOTE — Addendum Note (Signed)
Addended by: Mathis Bud on: 07/02/2017 12:15 PM   Modules accepted: Orders

## 2017-07-02 NOTE — Progress Notes (Signed)
_0  ID: [name], male    DOB: 02/21/1956, 62 y.o.   MRN: [id number]  Chief Complaint  Patient presents with  . Follow-up    COPD     Referring provider: Reynold Bowen, MD  HPI: 62 yo former smoker followed for GOLD III COPD /AB  Hx of substance abuse  PMH of High output CHF /Pulmonary HTN  TEST  PFT 2011 FEV1 42, ratio 43, ++ BD response   07/02/2017  Follow up : COPD  Patient presents for a follow-up for COPD.  Patient recently had 1.5 weeks of  increased cough wheezing and shortness of breath.  He was called in a prednisone taper last week , has a few days left. .  Patient says that he is feeling much improved.  He still has some lingering cough and  congestion . No fever. He denies any chest pain orthopnea PND or leg swelling.  He remains on Symbicort and Charlotte Crumb but admits does not take on regular basis . We discussed compliance .   Spirometry today difficult due to coughing.    Declines flu/pneumonia vaccines.   Allergies  Allergen Reactions  . Other Other (See Comments)    "Med for hypothyroidism. Killed white blood cells."--?Atenol  . Theophyllines      There is no immunization history on file for this patient.  Past Medical History:  Diagnosis Date  . CHF (congestive heart failure) ([place])   . Chronic hepatitis C ([place])   . COPD (chronic obstructive pulmonary disease) ([place])   . Hyperthyroidism   . Thrombocytopenia ([place])     Tobacco History: Social History   Tobacco Use  Smoking Status Former Smoker  . Packs/day: 1.00  . Years: 20.00  . Pack years: 20.00  . Types: Cigarettes  . Last attempt to quit: 06/18/1986  . Years since quitting: 31.0  Smokeless Tobacco Never Used   Counseling given: Not Answered   Outpatient Encounter Medications as of 07/02/2017  Medication Sig  . budesonide-formoterol (SYMBICORT) 160-4.5 MCG/ACT inhaler INHALE 2 PUFFS INTO THE LUNGS 2 TIMES DAILY.  Marland Kitchen Glycopyrrolate (SEEBRI NEOHALER) 15.6 MCG CAPS Place 1  capsule into inhaler and inhale 2 (two) times daily.  Marland Kitchen levalbuterol (XOPENEX) 0.63 MG/3ML nebulizer solution Take 3 mLs (0.63 mg total) by nebulization every 4 (four) hours as needed for wheezing or shortness of breath.  . Levothyroxine Sodium 100 MCG CAPS Take 100 mcg by mouth daily.   . predniSONE (DELTASONE) 10 MG tablet 4 tabs for 2 days, then 3 tabs for 2 days, 2 tabs for 2 days, then 1 tab for 2 days, then stop  . predniSONE (DELTASONE) 10 MG tablet 4 tabs x 3 days, 3 tabs x 3 days, 2 tabs x 3 days, 1 tab x 3 days.  Marland Kitchen Respiratory Therapy Supplies (AIRS DISPOSABLE NEBULIZER) KIT Use as directed  . SYMBICORT 160-4.5 MCG/ACT inhaler USE 2 INHALATIONS ORALLY   TWICE DAILY  . Diclofenac Sodium CR (VOLTAREN-XR) 100 MG 24 hr tablet Take 1 tablet (100 mg total) by mouth daily. (Patient not taking: Reported on 07/02/2017)   No facility-administered encounter medications on file as of 07/02/2017.      Review of Systems  Constitutional:   No  weight loss, night sweats,  Fevers, chills, +fatigue, or  lassitude.  HEENT:   No headaches,  Difficulty swallowing,  Tooth/dental problems, or  Sore throat,                No sneezing, itching, ear ache,  +  nasal congestion, post nasal drip,   CV:  No chest pain,  Orthopnea, PND, swelling in lower extremities, anasarca, dizziness, palpitations, syncope.   GI  No heartburn, indigestion, abdominal pain, nausea, vomiting, diarrhea, change in bowel habits, loss of appetite, bloody stools.   Resp:    No chest wall deformity  Skin: no rash or lesions.  GU: no dysuria, change in color of urine, no urgency or frequency.  No flank pain, no hematuria   MS:  No joint pain or swelling.  No decreased range of motion.  No back pain.    Physical Exam  BP 130/82 (BP Location: Left Arm, Cuff Size: Normal)   Pulse 60   Ht _0  (1.778 m)   Wt 197 lb (89.4 kg)   SpO2 99%   BMI 28.27 kg/m   GEN: A/Ox3; pleasant , NAD, elderly    HEENT:  [place]/AT,   EACs-clear, TMs-wnl, NOSE-clear drainage  THROAT-clear, no lesions, no postnasal drip or exudate noted.   NECK:  Supple w/ fair ROM; no JVD; normal carotid impulses w/o bruits; no thyromegaly or nodules palpated; no lymphadenopathy.    RESP  Diminished BS in bases , . no accessory muscle use, no dullness to percussion Faint exp wheezing on forced exp.   CARD:  RRR, no m/r/g, no peripheral edema, pulses intact, no cyanosis or clubbing.  GI:   Soft & nt; nml bowel sounds; no organomegaly or masses detected.   Musco: Warm bil, no deformities or joint swelling noted.   Neuro: alert, no focal deficits noted.    Skin: Warm, no lesions or rashes    Lab Results:  CBC  BMET   BNP No results found for: BNP  ProBNP  Imaging:    Assessment & Plan:   COPD exacerbation (HCC) Resolving COPD flare  Check cxr  Finish steroids   Plan  Patient Instructions  Chest xray today  Finish Prednisone as directed.  Take Symbicort 2 puffs Twice daily   Take Charlotte Crumb Twice daily   Rinse after inhalers  Follow up  Dr. Lamonte Sakai  In 6 months and As needed   Please contact office for sooner follow up if symptoms do not improve or worsen or seek emergency care       Allergic rhinitis due to other allergen Control for triggers   Plan  Patient Instructions  Chest xray today  Finish Prednisone as directed.  Take Symbicort 2 puffs Twice daily   Take Charlotte Crumb Twice daily   Rinse after inhalers  Follow up  Dr. Lamonte Sakai  In 6 months and As needed   Please contact office for sooner follow up if symptoms do not improve or worsen or seek emergency care          Rexene Edison, NP 07/02/2017

## 2017-07-02 NOTE — Patient Instructions (Signed)
Chest xray today  Finish Prednisone as directed.  Take Symbicort 2 puffs Twice daily   Take Charlotte Crumb Twice daily   Rinse after inhalers  Follow up  Dr. Lamonte Sakai  In 6 months and As needed   Please contact office for sooner follow up if symptoms do not improve or worsen or seek emergency care

## 2017-07-02 NOTE — Assessment & Plan Note (Addendum)
Resolving COPD flare  Check cxr  Finish steroids   Plan  Patient Instructions  Chest xray today  Finish Prednisone as directed.  Take Symbicort 2 puffs Twice daily   Take Charlotte Crumb Twice daily   Rinse after inhalers  Follow up  Dr. Lamonte Sakai  In 6 months and As needed   Please contact office for sooner follow up if symptoms do not improve or worsen or seek emergency care

## 2017-07-02 NOTE — Assessment & Plan Note (Signed)
Control for triggers   Plan  Patient Instructions  Chest xray today  Finish Prednisone as directed.  Take Symbicort 2 puffs Twice daily   Take Charlotte Crumb Twice daily   Rinse after inhalers  Follow up  Dr. Lamonte Sakai  In 6 months and As needed   Please contact office for sooner follow up if symptoms do not improve or worsen or seek emergency care

## 2017-07-25 ENCOUNTER — Ambulatory Visit: Payer: BC Managed Care – PPO | Admitting: Emergency Medicine

## 2017-07-25 ENCOUNTER — Encounter: Payer: Self-pay | Admitting: Emergency Medicine

## 2017-07-25 VITALS — BP 132/84 | HR 68 | Ht 70.0 in | Wt 196.0 lb

## 2017-07-25 DIAGNOSIS — J209 Acute bronchitis, unspecified: Secondary | ICD-10-CM | POA: Diagnosis not present

## 2017-07-25 DIAGNOSIS — J440 Chronic obstructive pulmonary disease with acute lower respiratory infection: Secondary | ICD-10-CM

## 2017-07-25 MED ORDER — LEVOFLOXACIN 500 MG PO TABS: 500.0000 mg | ORAL_TABLET | Freq: Every day | ORAL | 0 refills | Status: DC

## 2017-07-25 NOTE — Progress Notes (Signed)
History of Present Illness:  62 year old male former smoker and substance abuser with h/o childhood asthma, history Graves and hyperthyroidism s/p incomplete ablation, high output heart failure with associated pulm HTN (R heart cath 08/25/08), severe AFL with an asthmatic component on full PFT's 2010, CPEX, R heart cath to initiate eval. Had repeat thyroid ablation (Dr Chalmers Cater), and since then A fib and diastolic dysfxn improved.   I have not seen him since 02/2015. He has been seen recently in our office by T Parrett, treated for a possible acute exacerbation after being exposed to URI. I treated him w pred over the phone 06/24/17. He believes that he improved some, had some more energy, cough has returned and worsened. He is now coughing up dark green, brownish mucous. Also with HA and purulent sinus drainage. Using xopenex nebs about 3x a week.    Vitals:   07/25/17 0921  BP: 132/84  Pulse: 68  SpO2: 95%  Weight: 196 lb (88.9 kg)  Height: 5\' 10"  (1.778 m)   Gen: Pleasant, well-nourished, in no distress,  normal affect  ENT: No lesions,  mouth clear,  oropharynx clear, no postnasal drip  Neck: No JVD, no stridor  Lungs: No use of accessory muscles, clear without rales or rhonchi, no wheeze today, no crackles  Cardiovascular: RRR, heart sounds normal, no murmur or gallops, no peripheral edema  Musculoskeletal: No deformities, no cyanosis or clubbing  Neuro: alert, non focal  Skin: Warm, no lesions or rashes   Chest x-ray 07/02/17 COMPARISON:  08/31/2008  FINDINGS: The cardiac silhouette is normal in size and configuration. Normal mediastinal and hilar contours.  There are reticular opacities at both lung bases consistent with scarring or subsegmental atelectasis or a combination. Lungs are hyperexpanded but otherwise clear.  No pleural effusion or pneumothorax.  Skeletal structures are intact.  IMPRESSION: 1. No acute cardiopulmonary disease. 2. Lung base scarring or  subsegmental atelectasis. Lung hyperexpansion suggests COPD   Acute bronchitis with COPD ([place]) He is not wheezing on exam, appears to have benefited some from the prednisone but now symptoms are consistent with an acute bronchitis, possibly superimposed acute sinusitis.  He needs to be treated with antibiotics.  I think we can defer repeat steroids for now.   Please take Levaquin 500 mg daily for the next 10 days. Call our office after 4-5 days if you are not improving. Follow with Dr Lamonte Sakai in 6 months or sooner if you have any problems  COPD with asthma ([place]) Acute exacerbation improved, wheezing resolved on exam.  Do not believe we need to treat again with steroids.  Please continue your Symbicort and your Charlotte Crumb as you have been taking them.  Remember to rinse and gargle after using. Keep your Xopenex nebulizer available to use if needed for chest tightness, wheezing, shortness of breath.   [name], MD, PhD 07/25/2017, 9:48 AM [facility] [phone] or if no answer [phone]

## 2017-07-25 NOTE — Assessment & Plan Note (Signed)
Acute exacerbation improved, wheezing resolved on exam.  Do not believe we need to treat again with steroids.  Please continue your Symbicort and your Charlotte Crumb as you have been taking them.  Remember to rinse and gargle after using. Keep your Xopenex nebulizer available to use if needed for chest tightness, wheezing, shortness of breath.

## 2017-07-25 NOTE — Assessment & Plan Note (Signed)
He is not wheezing on exam, appears to have benefited some from the prednisone but now symptoms are consistent with an acute bronchitis, possibly superimposed acute sinusitis.  He needs to be treated with antibiotics.  I think we can defer repeat steroids for now.   Please take Levaquin 500 mg daily for the next 10 days. Call our office after 4-5 days if you are not improving. Follow with Dr Lamonte Sakai in 6 months or sooner if you have any problems

## 2017-07-25 NOTE — Patient Instructions (Addendum)
Please continue your Symbicort and your Charlotte Crumb as you have been taking them.  Remember to rinse and gargle after using. Keep your Xopenex nebulizer available to use if needed for chest tightness, wheezing, shortness of breath. Please take Levaquin 500 mg daily for the next 10 days. Call our office after 4-5 days if you are not improving. Follow with Dr Lamonte Sakai in 6 months or sooner if you have any problems

## 2017-09-20 ENCOUNTER — Telehealth: Payer: Self-pay | Admitting: Emergency Medicine

## 2017-09-20 MED ORDER — GLYCOPYRROLATE 15.6 MCG IN CAPS: 1.0000 | ORAL_CAPSULE | Freq: Two times a day (BID) | RESPIRATORY_TRACT | 0 refills | Status: DC

## 2017-09-20 MED ORDER — BUDESONIDE-FORMOTEROL FUMARATE 160-4.5 MCG/ACT IN AERO: INHALATION_SPRAY | RESPIRATORY_TRACT | 3 refills | Status: DC

## 2017-09-20 NOTE — Telephone Encounter (Signed)
Called pt letting him know I was sending a refill script [facility]ort to [facility] him and that I was placing a couple samples of seebri neohaler up front for him to come pick up Monday.  Pt expressed understanding. Nothing further needed at this time.

## 2017-09-30 ENCOUNTER — Telehealth: Payer: Self-pay | Admitting: Emergency Medicine

## 2017-09-30 NOTE — Telephone Encounter (Signed)
Attempted to call the pt. I did not receive an answer. I have left a message for the pt to return our call.  

## 2017-10-01 NOTE — Telephone Encounter (Signed)
Attempted to call pt but no answer. Left message for pt to return call x2 

## 2017-10-02 NOTE — Telephone Encounter (Signed)
Called patient, unable to reach. Left message x3 for patient to give [place] a call back. Per triage protocol will close encounter.

## 2017-10-03 ENCOUNTER — Telehealth: Payer: Self-pay | Admitting: Emergency Medicine

## 2017-10-03 NOTE — Telephone Encounter (Signed)
Called and spoke with pt who stated he saw an advertisement for a power breathe machine and wanted to see if it could possibly help with his COPD and breathing.  Before pt bought this, he wanted to make sure Dr. Lamonte Sakai said it would be fine for him to try.  Dr. Lamonte Sakai, please advise on this for pt. Thanks!

## 2017-10-14 NOTE — Telephone Encounter (Signed)
I'm not sure I know what machine he is talking about. Can you have him send [place] the details - exactly what it is called - so I can research it and figure out whether there may be benefit?

## 2017-10-14 NOTE — Telephone Encounter (Signed)
Attempted to call the pt. I did not receive an answer. I have left a message for pt to return our call.  

## 2017-10-15 NOTE — Telephone Encounter (Signed)
ATC pt, no answer. Left message for pt to call back.  

## 2017-10-16 NOTE — Telephone Encounter (Signed)
[email or web address]  This is what the patient is talking about.

## 2017-10-18 NOTE — Telephone Encounter (Signed)
lmtcb for pt to make aware of RB's recs.

## 2017-10-18 NOTE — Telephone Encounter (Signed)
Let him know that I reviewed the literature. I have never used this specific system before but I do know that any device that will work on his respiratory muscle strength (for example incentive spirometry) can be beneficial. I think it is OK for him to try it if he is willing to buy it.

## 2017-10-21 NOTE — Telephone Encounter (Signed)
Called patient unable to reach left message to give us a call back.

## 2017-10-22 NOTE — Telephone Encounter (Signed)
Attempted to call the pt. I did not receive an answer. I have left a message for pt to return our call.  

## 2017-10-23 NOTE — Telephone Encounter (Signed)
Per triage protocol, message will be closed, attempted to reach patient X 3.

## 2018-03-06 ENCOUNTER — Telehealth: Payer: Self-pay | Admitting: Emergency Medicine

## 2018-03-06 MED ORDER — DOXYCYCLINE HYCLATE 100 MG PO TABS: 100.0000 mg | ORAL_TABLET | Freq: Two times a day (BID) | ORAL | 0 refills | Status: DC

## 2018-03-06 NOTE — Telephone Encounter (Signed)
Called pt and advised message from the provider. Pt understood and verbalized understanding. Nothing further is needed.    Rx sent in. 

## 2018-03-06 NOTE — Telephone Encounter (Signed)
Please have him take doxycycline 100mg  bid x 7 days.

## 2018-03-06 NOTE — Telephone Encounter (Signed)
Spoke with patient, reports his face is very tender, runny nose, head feels stuffy and is starting to developing cough with drainage at the back of his throat. Patient is out of town and would like something to take.   RB please advise. Thanks.

## 2018-03-21 ENCOUNTER — Encounter: Payer: Self-pay | Admitting: Emergency Medicine

## 2018-03-21 ENCOUNTER — Ambulatory Visit (INDEPENDENT_AMBULATORY_CARE_PROVIDER_SITE_OTHER): Payer: Federal, State, Local not specified - PPO | Admitting: Emergency Medicine

## 2018-03-21 DIAGNOSIS — J301 Allergic rhinitis due to pollen: Secondary | ICD-10-CM

## 2018-03-21 DIAGNOSIS — J449 Chronic obstructive pulmonary disease, unspecified: Secondary | ICD-10-CM

## 2018-03-21 MED ORDER — BUDESONIDE-FORMOTEROL FUMARATE 160-4.5 MCG/ACT IN AERO: INHALATION_SPRAY | RESPIRATORY_TRACT | 3 refills | Status: DC

## 2018-03-21 MED ORDER — GLYCOPYRROLATE 15.6 MCG IN CAPS: 1.0000 | ORAL_CAPSULE | Freq: Two times a day (BID) | RESPIRATORY_TRACT | 3 refills | Status: DC

## 2018-03-21 NOTE — Assessment & Plan Note (Signed)
Please start taking your Claritin (loratadine) 10 mg every day when you are encountering significant allergic exposures.  This may be true in the spring and fall, or when you are traveling to [place], [place]

## 2018-03-21 NOTE — Patient Instructions (Signed)
We will plan to continue your Symbicort and Charlotte Crumb as you have been taking them. Keep your Xopenex available to use up to every 4 hours if needed for shortness of breath, wheezing, chest tightness. If you change your mind about the flu shot let us know.  Would be happy to give it to you. Please start taking your Claritin (loratadine) 10 mg every day when you are encountering significant allergic exposures.  This may be true in the spring and fall, or when you are traveling to [place], [place] Follow with Dr Lamonte Sakai in 6 months or sooner if you have any problems

## 2018-03-21 NOTE — Assessment & Plan Note (Signed)
We will plan to continue your Symbicort and Charlotte Crumb as you have been taking them. Keep your Xopenex available to use up to every 4 hours if needed for shortness of breath, wheezing, chest tightness. If you change your mind about the flu shot let us know.  Would be happy to give it to you. Follow with Dr Lamonte Sakai in 6 months or sooner if you have any problems

## 2018-03-21 NOTE — Progress Notes (Signed)
History of Present Illness:  62 year old male former smoker and substance abuser with h/o childhood asthma, history Graves and hyperthyroidism s/p incomplete ablation, high output heart failure with associated pulm HTN (R heart cath 08/25/08), severe AFL with an asthmatic component on full PFT's 2010, CPEX, R heart cath to initiate eval. Had repeat thyroid ablation (Dr Chalmers Cater), and since then A fib and diastolic dysfxn improved.   I have not seen him since 02/2015. He has been seen recently in our office by T Parrett, treated for a possible acute exacerbation after being exposed to URI. I treated him w pred over the phone 06/24/17. He believes that he improved some, had some more energy, cough has returned and worsened. He is now coughing up dark green, brownish mucous. Also with HA and purulent sinus drainage. Using xopenex nebs about 3x a week.   [name] 03/21/18 --62 year old former smoker with a history of COPD/asthma, severe obstruction.  Last seen in February 2019.  He has been managed on Symbicort and Charlotte Crumb, has Xopenex available to use as needed - rarely.  He reports today that has been in [place], [place], relocated there in 01/2018. He is having sinus sx, rhinitis with the exposures there. I treated him with doxycycline when this happened, started claritin as well. He has improved since then. He is now using the claritin prn. Doesn't want the flu shot or pneumonia shots.    Vitals:   03/21/18 1449  BP: 132/80  Pulse: 66  SpO2: 95%  Weight: 187 lb (84.8 kg)  Height: 5\' 10"  (1.778 m)   Gen: Pleasant, well-nourished, in no distress,  normal affect  ENT: No lesions,  mouth clear,  oropharynx clear, no postnasal drip  Neck: No JVD, no stridor  Lungs: No use of accessory muscles, clear without rales or rhonchi, no wheeze today, no crackles  Cardiovascular: RRR, heart sounds normal, no murmur or gallops, no peripheral edema  Musculoskeletal: No deformities, no cyanosis or clubbing  Neuro: alert, non  focal  Skin: Warm, no lesions or rashes   Chest x-ray 07/02/17 COMPARISON:  08/31/2008  FINDINGS: The cardiac silhouette is normal in size and configuration. Normal mediastinal and hilar contours.  There are reticular opacities at both lung bases consistent with scarring or subsegmental atelectasis or a combination. Lungs are hyperexpanded but otherwise clear.  No pleural effusion or pneumothorax.  Skeletal structures are intact.  IMPRESSION: 1. No acute cardiopulmonary disease. 2. Lung base scarring or subsegmental atelectasis. Lung hyperexpansion suggests COPD   COPD with asthma ([place]) We will plan to continue your Symbicort and Charlotte Crumb as you have been taking them. Keep your Xopenex available to use up to every 4 hours if needed for shortness of breath, wheezing, chest tightness. If you change your mind about the flu shot let us know.  Would be happy to give it to you. Follow with Dr Lamonte Sakai in 6 months or sooner if you have any problems  Allergic rhinitis Please start taking your Claritin (loratadine) 10 mg every day when you are encountering significant allergic exposures.  This may be true in the spring and fall, or when you are traveling to [place], [place]   Norman Apo, MD, PhD 03/21/2018, 3:02 PM [facility] [phone] or if no answer [phone]

## 2018-05-06 ENCOUNTER — Telehealth: Payer: Self-pay | Admitting: Emergency Medicine

## 2018-05-06 MED ORDER — BUDESONIDE-FORMOTEROL FUMARATE 160-4.5 MCG/ACT IN AERO: INHALATION_SPRAY | RESPIRATORY_TRACT | 3 refills | Status: DC

## 2018-05-06 NOTE — Telephone Encounter (Signed)
Attempted to call pt but unable to reach him and unable to leave a VM due to no mailbox kicking in. I have sent pt's symbicort Rx to the requested pharmacy for pt in a 90 day supply. Nothing further needed.

## 2018-05-28 ENCOUNTER — Encounter: Payer: Self-pay | Admitting: Internal Medicine

## 2018-05-28 DIAGNOSIS — Z125 Encounter for screening for malignant neoplasm of prostate: Secondary | ICD-10-CM | POA: Diagnosis not present

## 2018-05-28 DIAGNOSIS — Z0000 Encounter for general adult medical examination without abnormal findings: Secondary | ICD-10-CM | POA: Diagnosis not present

## 2018-05-28 DIAGNOSIS — E559 Vitamin D deficiency, unspecified: Secondary | ICD-10-CM | POA: Diagnosis not present

## 2018-05-28 DIAGNOSIS — I10 Essential (primary) hypertension: Secondary | ICD-10-CM | POA: Diagnosis not present

## 2018-05-28 DIAGNOSIS — E038 Other specified hypothyroidism: Secondary | ICD-10-CM | POA: Diagnosis not present

## 2018-05-28 DIAGNOSIS — D510 Vitamin B12 deficiency anemia due to intrinsic factor deficiency: Secondary | ICD-10-CM | POA: Diagnosis not present

## 2018-05-30 DIAGNOSIS — Z0000 Encounter for general adult medical examination without abnormal findings: Secondary | ICD-10-CM | POA: Diagnosis not present

## 2018-05-30 DIAGNOSIS — I10 Essential (primary) hypertension: Secondary | ICD-10-CM | POA: Diagnosis not present

## 2018-05-30 DIAGNOSIS — D510 Vitamin B12 deficiency anemia due to intrinsic factor deficiency: Secondary | ICD-10-CM | POA: Diagnosis not present

## 2018-05-30 DIAGNOSIS — E890 Postprocedural hypothyroidism: Secondary | ICD-10-CM | POA: Diagnosis not present

## 2018-05-30 DIAGNOSIS — E559 Vitamin D deficiency, unspecified: Secondary | ICD-10-CM | POA: Diagnosis not present

## 2018-06-12 ENCOUNTER — Other Ambulatory Visit: Payer: Self-pay | Admitting: Emergency Medicine

## 2018-07-21 ENCOUNTER — Telehealth: Payer: Self-pay | Admitting: Internal Medicine

## 2018-07-21 NOTE — Telephone Encounter (Signed)
Hi Dr. Henrene Pastor, we have received a referral from pt's PCP for a repeat colon. Pt had a colonoscopy in 2017. We received records and will be placed on your desk for review. Please advise on scheduling.

## 2018-07-23 NOTE — Telephone Encounter (Signed)
Left message to call back to schedule proc.

## 2018-07-23 NOTE — Telephone Encounter (Signed)
Colonoscopy from [facility] in [place] dated Oct 27, 2015 (Dr. Saunders Revel) has been reviewed.  The patient was found to have an 11 mm transverse colon polyp which was removed.  Pathology showed tubular adenoma.  The examination was otherwise normal.  Repeat colonoscopy in 3 years recommended.  This is appropriate based on the available information.  Okay to schedule colonoscopy in the [place] with me.  Thank you

## 2018-07-28 ENCOUNTER — Encounter: Payer: Self-pay | Admitting: Internal Medicine

## 2018-08-27 ENCOUNTER — Telehealth (HOSPITAL_COMMUNITY): Payer: Self-pay | Admitting: Vascular Surgery

## 2018-08-27 NOTE — Telephone Encounter (Signed)
Left pt message to make new chf appt w/ db (non URGENT) Next ava

## 2018-09-02 ENCOUNTER — Encounter: Payer: Federal, State, Local not specified - PPO | Admitting: Internal Medicine

## 2018-09-07 ENCOUNTER — Telehealth: Payer: Self-pay | Admitting: *Deleted

## 2018-09-07 NOTE — Telephone Encounter (Signed)
Reschedule in a few months after Covid-19 issue settles down. Thanks

## 2018-09-07 NOTE — Telephone Encounter (Signed)
Covid-19 travel screening questions  Have you traveled in the last 14 days?yes If yes where? [place]  Do you now or have you had a fever in the last 14 days? no  Do you have any respiratory symptoms of shortness of breath or cough now or in the last 14 days? no  Do you have a medical history of Congestive Heart Failure? Had issues in the past d/t hypothyroidism  Do you have a medical history of lung disease? asthma  Do you have any family members or close contacts with diagnosed or suspected Covid-19? no   Dr. Henrene Pastor, Would you please review the answers to these questions?  He has traveled to [place] within the last two weeks.  He does have asthma but states it is under control.    Thanks, [employer]

## 2018-09-08 ENCOUNTER — Encounter: Payer: Self-pay | Admitting: Internal Medicine

## 2018-09-08 NOTE — Telephone Encounter (Signed)
Rescheduled per front desk

## 2018-09-22 ENCOUNTER — Encounter: Payer: Federal, State, Local not specified - PPO | Admitting: Internal Medicine

## 2018-10-15 ENCOUNTER — Encounter: Payer: Federal, State, Local not specified - PPO | Admitting: Internal Medicine

## 2018-10-20 ENCOUNTER — Other Ambulatory Visit: Payer: Self-pay

## 2018-10-20 ENCOUNTER — Telehealth: Payer: Self-pay | Admitting: Emergency Medicine

## 2018-10-20 ENCOUNTER — Ambulatory Visit (HOSPITAL_COMMUNITY)
Admission: RE | Admit: 2018-10-20 | Discharge: 2018-10-20 | Disposition: A | Discharge status: Home / Self Care | Payer: Federal, State, Local not specified - PPO | Source: Ambulatory Visit | Attending: Internal Medicine | Admitting: Internal Medicine

## 2018-10-20 DIAGNOSIS — I480 Paroxysmal atrial fibrillation: Secondary | ICD-10-CM

## 2018-10-20 DIAGNOSIS — I5032 Chronic diastolic (congestive) heart failure: Secondary | ICD-10-CM

## 2018-10-20 DIAGNOSIS — I10 Essential (primary) hypertension: Secondary | ICD-10-CM

## 2018-10-20 NOTE — Patient Instructions (Signed)
Your physician recommends that you have an Exercise Tolerance Test. You will receive a call when these are available again.  Please call us to schedule an appointment when you return to town.

## 2018-10-20 NOTE — Progress Notes (Signed)
Heart Failure TeleHealth Note  Due to national recommendations of social distancing due to [place] 19, Audio/video telehealth visit is felt to be most appropriate for this patient at this time.  See MyChart message from today for patient consent regarding telehealth for [facility].  Date:  10/20/2018   ID:  BRANNEN KOPPEN, DOB [birth year], MRN [id number]  Location: Home  Provider location: [facility] Type of Visit: New patient  PCP:  Reynold Bowen, MD  Cardiologist:  No primary care provider on file. Primary HF: [place]  Chief Complaint: Heart Failure follow-up   History of Present Illness:  [name] is a 63 y/o male with h/o life threatening hyperthyroidism, PAF, diastolic heart failure and COPD/asthma.  Admitted in 2010 with HF in setting of severe hyperthyroidism and AF with RVR.  08/23/2008 R[facility] Right atrial pressure mean of 19 RV pressure 73/7 with an EDP of 20 PA pressure 70/22 with a mean of 43 PCWP 23 with a V-wave to 38.  Fick cardiac output 6.0 with an index of 3.4. PVR 3.3 [place] units.  Had repeat I-131 ablation on 09/17/2008. Maintained SR. Off tikosyn and coumadin 2010.   Echo 1/14: EF 55-60% Normal RV. Normal diastolic parameters ETT 6/65: Walked 8:01 on [place] max HR 164 bpm. HTNive response 222/96. No ST-T changes  We have not seen him since 2015.   He presents today for a telehealth visit in setting of the [place] pandemic.   He has been working in [place], [place] but about to transfer back to [place] and presents today yo re-establish care. Feels pretty good. Has not been exercising. Hasn't been to the gym in 1.5 years. Just stopped because he was busy. No CP or SOB. No recent palpitations, CP or HF. Sees Dr. Forde Dandy 2x/year. BP well controlled. No snoring.   [name] denies symptoms worrisome for COVID 19.   Past Medical History:  Diagnosis Date  . CHF (congestive heart failure) ([place])   . Chronic hepatitis C ([place])   .  COPD (chronic obstructive pulmonary disease) ([place])   . Hyperthyroidism   . Thrombocytopenia ([place])    No past surgical history on file.   Current Outpatient Medications  Medication Sig Dispense Refill  . budesonide-formoterol (SYMBICORT) 160-4.5 MCG/ACT inhaler INHALE 2 PUFFS INTO THE LUNGS 2 TIMES DAILY. 3 Inhaler 3  . levalbuterol (XOPENEX) 0.63 MG/3ML nebulizer solution Take 3 mLs (0.63 mg total) by nebulization every 4 (four) hours as needed for wheezing or shortness of breath. 1080 mL 3  . levothyroxine (SYNTHROID, LEVOTHROID) 100 MCG tablet Take by mouth.    . Respiratory Therapy Supplies (AIRS DISPOSABLE NEBULIZER) KIT Use as directed 10 each 0  . SEEBRI NEOHALER 15.6 MCG CAPS PLACE 1 CAPSULE INTO       INHALER AND INHALE TWO     TIMES A DAY 180 capsule 3   No current facility-administered medications for this encounter.     Allergies:   Other and Theophyllines   Social History:  The patient  reports that he quit smoking about 32 years ago. His smoking use included cigarettes. He has a 20.00 pack-year smoking history. He has never used smokeless tobacco. He reports that he does not drink alcohol or use drugs.   Family History:  The patient's family history is not on file.   ROS:  Please see the history of present illness.   All other systems are personally reviewed and negative.   Exam:  (Video/Tele Health  Call; Exam is subjective and or/visual.) General:  Speaks in full sentences. No resp difficulty. Lungs: Normal respiratory effort with conversation.  Abdomen: Non-distended per patient report Extremities: Pt denies edema. Neuro: Alert & oriented x 3.   Recent Labs: No results found for requested labs within last 8760 hours.  Personally reviewed   Wt Readings from Last 3 Encounters:  03/21/18 84.8 kg (187 lb)  07/25/17 88.9 kg (196 lb)  07/02/17 89.4 kg (197 lb)      ASSESSMENT AND PLAN:  ASSESSMENT & PLAN: 1) PAF in setting of hyperthyroidism - resolved 2) HTN   Doing very well. Given that he has been fairly sedentary agree with ETT prior to returning to exercise program. WIll schedule after June 1. Can also consider coronary calcium scoring.   COVID screen The patient does not have any symptoms that suggest any further testing/ screening at this time.  Social distancing reinforced today.  Recommended follow-up:  As above  Relevant cardiac medications were reviewed at length with the patient today.   The patient does not have concerns regarding their medications at this time.   The following changes were made today:  As above  Today, I have spent 21 minutes with the patient with telehealth technology discussing the above issues .    Signed, Glori Bickers, MD  10/20/2018 11:14 AM  [facility] [place] [address] [facility] [place] [address] [phone] (office) [phone] (fax)

## 2018-10-20 NOTE — Telephone Encounter (Signed)
Called patient re: document that he faxed. Paperwork from [place] documenting patient medical history. Faxed today at 2:54 to [phone]. Pt states it should be 2 sheets including the cover. I was going to provide contact info for [place]. Advised patient that all medical disability,FMLA, short and or long term disability is handled by [employer]. Pt states his spouse is a Freight forwarder for [employer]. He will speak with her this afternoon and if any thing furhter needed from out office he will call back.  Nothing further needed at this time.

## 2018-10-20 NOTE — Addendum Note (Signed)
Encounter addended by: Marlise Eves, RN on: 10/20/2018 11:34 AM  Actions taken: Visit diagnoses modified, Order list changed, Diagnosis association updated, Clinical Note Signed

## 2018-10-21 ENCOUNTER — Telehealth: Payer: Self-pay | Admitting: Emergency Medicine

## 2018-10-21 NOTE — Telephone Encounter (Signed)
Called the patient back. He stated he contacted [employer] and was told he needed to sign a release form from our office.   Message routed to [employer] Lead to confirm.  Patrice, can you please take a look at the message above as well as the note in the chart from yesterday and advise if the form he was told about was a medical release form? Thank you.

## 2018-10-21 NOTE — Telephone Encounter (Signed)
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

## 2018-10-21 NOTE — Telephone Encounter (Signed)
Primary Pulmonologist: RB Last office visit and with whom: RB 03-26-18 What do we see them for (pulmonary problems): COPD with Asthma Last OV assessment/plan: We will plan to continue your Symbicort and [place] as you have been taking them. Keep your Xopenex available to use up to every 4 hours if needed for shortness of breath, wheezing, chest tightness. If you change your mind about the flu shot let us know.  Would be happy to give it to you. Please start taking your Claritin (loratadine) 10 mg every day when you are encountering significant allergic exposures.  This may be true in the spring and fall, or when you are traveling to [place], [place] Follow with Dr Lamonte Sakai in 6 months or sooner if you have any problems  Was appointment offered to patient (explain)?  no  Reason for call: pt called today in need of a letter to his employment stating that with his lung condition of COPD with Asthma he is recommended to work remotely from home during the [place] pandemic. Pt has requested to work remote from home due to the [place] pandemic, had to go through several processes with his work. He had to complete training, and have testing. Pt has multiply forms from his company's HR dept to complete and fill out, and in need of this letter to submit back to his work. The letter once completed if able to complete will need to be faxed to fax [phone]  TP please advise. Thank you.

## 2018-10-21 NOTE — Telephone Encounter (Signed)
Yes, the form should be the medical release form that we do have in our office. He should still be in contact with [employer], because there is usually a fee associated with completing any form. -pr

## 2018-10-21 NOTE — Telephone Encounter (Signed)
Can use high risk population letter for work that we have  I will be glad to sign for pt   Please contact office for sooner follow up if symptoms do not improve or worsen or seek emergency care   Make sure he makes his follow up as was recommended to have 6 month follow up   Can do televisit /virtual visit  Please contact office for sooner follow up if symptoms do not improve or worsen or seek emergency care

## 2018-10-21 NOTE — Telephone Encounter (Signed)
LVM for patient to advise if he can call back to confirm if he wants the release form faxed to him for signature, which he will have to fax back, or if he wants to come to clinic at front door to sign so it can then be sent to [employer]. Requested call back to confirm what he prefers.

## 2018-10-22 ENCOUNTER — Telehealth: Payer: Self-pay | Admitting: *Deleted

## 2018-10-22 NOTE — Telephone Encounter (Signed)
LM on VM to reschedule postponed procedure.  Asked patient to call back to reschedule.

## 2018-10-22 NOTE — Telephone Encounter (Signed)
Pt returned call.  Rescheduled for 5/18 @ 930 with PV on 5/11 @ 9.  Pt is aware that PV is a phone visit.

## 2018-10-24 ENCOUNTER — Encounter (HOSPITAL_COMMUNITY): Payer: Federal, State, Local not specified - PPO | Admitting: Internal Medicine

## 2018-10-27 ENCOUNTER — Other Ambulatory Visit: Payer: Self-pay

## 2018-10-27 ENCOUNTER — Ambulatory Visit: Payer: Federal, State, Local not specified - PPO | Admitting: *Deleted

## 2018-10-27 ENCOUNTER — Encounter: Payer: Self-pay | Admitting: Internal Medicine

## 2018-10-27 VITALS — Ht 70.0 in | Wt 185.0 lb

## 2018-10-27 DIAGNOSIS — Z86010 Personal history of colonic polyps: Secondary | ICD-10-CM

## 2018-10-27 MED ORDER — NA SULFATE-K SULFATE-MG SULF 17.5-3.13-1.6 GM/177ML PO SOLN: ORAL | 0 refills | Status: DC

## 2018-10-27 NOTE — Progress Notes (Signed)
Pre-visit done via phone with the patient. Patient denies any allergies to eggs or soy. Patient denies any problems with anesthesia/sedation. Patient denies any oxygen use at home. Patient denies taking any diet/weight loss medications or blood thinners. EMMI education assisgned to patient on colonoscopy, this was explained and instructions given to patient. Instructions mailed to patient today and Suprep coupoon-pt is aware.

## 2018-10-31 ENCOUNTER — Telehealth: Payer: Self-pay | Admitting: *Deleted

## 2018-10-31 NOTE — Telephone Encounter (Signed)
No answer after second attempt of the covid screening left message. SM

## 2018-10-31 NOTE — Telephone Encounter (Signed)
No answer for first attempt at covid screening.Will call back SM

## 2018-11-03 ENCOUNTER — Encounter: Payer: Self-pay | Admitting: Internal Medicine

## 2018-11-03 ENCOUNTER — Other Ambulatory Visit: Payer: Self-pay

## 2018-11-03 ENCOUNTER — Ambulatory Visit (AMBULATORY_SURGERY_CENTER): Payer: Federal, State, Local not specified - PPO | Admitting: Internal Medicine

## 2018-11-03 ENCOUNTER — Telehealth: Payer: Self-pay | Admitting: Emergency Medicine

## 2018-11-03 VITALS — BP 151/78 | HR 55 | Temp 97.5°F | Resp 17 | Ht 70.0 in | Wt 185.0 lb

## 2018-11-03 DIAGNOSIS — D120 Benign neoplasm of cecum: Secondary | ICD-10-CM | POA: Diagnosis not present

## 2018-11-03 DIAGNOSIS — Z86010 Personal history of colonic polyps: Secondary | ICD-10-CM | POA: Diagnosis not present

## 2018-11-03 MED ORDER — SODIUM CHLORIDE 0.9 % IV SOLN: 500.0000 mL | INTRAVENOUS | Status: DC

## 2018-11-03 NOTE — Telephone Encounter (Signed)
Attempted to call pt but unable to reach. Left message for pt to return call. 

## 2018-11-03 NOTE — Progress Notes (Signed)
Report to PACU, RN, vss, BBS= Clear.  

## 2018-11-03 NOTE — Patient Instructions (Signed)
YOU HAD AN ENDOSCOPIC PROCEDURE TODAY AT [facility]:   Refer to the procedure report that was given to you for any specific questions about what was found during the examination.  If the procedure report does not answer your questions, please call your gastroenterologist to clarify.  If you requested that your care partner not be given the details of your procedure findings, then the procedure report has been included in a sealed envelope for you to review at your convenience later.  YOU SHOULD EXPECT: Some feelings of bloating in the abdomen. Passage of more gas than usual.  Walking can help get rid of the air that was put into your GI tract during the procedure and reduce the bloating. If you had a lower endoscopy (such as a colonoscopy or flexible sigmoidoscopy) you may notice spotting of blood in your stool or on the toilet paper. If you underwent a bowel prep for your procedure, you may not have a normal bowel movement for a few days.  Please Note:  You might notice some irritation and congestion in your nose or some drainage.  This is from the oxygen used during your procedure.  There is no need for concern and it should clear up in a day or so.  SYMPTOMS TO REPORT IMMEDIATELY:   Following lower endoscopy (colonoscopy or flexible sigmoidoscopy):  Excessive amounts of blood in the stool  Significant tenderness or worsening of abdominal pains  Swelling of the abdomen that is new, acute  Fever of 100F or higher  For urgent or emergent issues, a gastroenterologist can be reached at any hour by calling [phone].   DIET:  We do recommend a small meal at first, but then you may proceed to your regular diet.  Drink plenty of fluids but you should avoid alcoholic beverages for 24 hours.  ACTIVITY:  You should plan to take it easy for the rest of today and you should NOT DRIVE or use heavy machinery until tomorrow (because of the sedation medicines used during the test).     FOLLOW UP: Our staff will call the number listed on your records 48-72 hours following your procedure to check on you and address any questions or concerns that you may have regarding the information given to you following your procedure. If we do not reach you, we will leave a message.  We will attempt to reach you two times.  During this call, we will ask if you have developed any symptoms of COVID 19. If you develop any symptoms (for example fever, flu-like symptoms, shortness of breath, cough etc.) before then, please call [phone].  If any biopsies were taken you will be contacted by phone or by letter within the next 1-3 weeks.  Please call us at [phone] if you have not heard about the biopsies in 3 weeks.    SIGNATURES/CONFIDENTIALITY: You and/or your care partner have signed paperwork which will be entered into your electronic medical record.  These signatures attest to the fact that that the information above on your After Visit Summary has been reviewed and is understood.  Full responsibility of the confidentiality of this discharge information lies with you and/or your care-partner.   Handouts were given to your care partner on polyps and diverticulosis. You may resume your current medications today. Await biopsy results. Repeat colonoscopy in 5 years. Please call if any questions or concerns.

## 2018-11-03 NOTE — Progress Notes (Signed)
Called to room to assist during endoscopic procedure.  Patient ID and intended procedure confirmed with present staff. Received instructions for my participation in the procedure from the performing physician.  

## 2018-11-03 NOTE — Telephone Encounter (Signed)
Pt returned called that he missed from the office and would like a call back at [phone]

## 2018-11-03 NOTE — Op Note (Signed)
[facility] Patient Name: [name] Procedure Date: 11/03/2018 9:19 AM MRN: [id number] Endoscopist: Docia Chuck. Henrene Pastor , MD Age: 63 Referring MD:  Date of Birth: 1956/06/18 Gender: Male Account #: [id number] Procedure:                Colonoscopy with cold snare polypectomy x 1 Indications:              High risk colon cancer surveillance: Personal                            history of adenoma (10 mm or greater in size).                            Previous examination May 2017 ([place]) Medicines:                Monitored Anesthesia Care Procedure:                Pre-Anesthesia Assessment:                           - Prior to the procedure, a History and Physical                            was performed, and patient medications and                            allergies were reviewed. The patient's tolerance of                            previous anesthesia was also reviewed. The risks                            and benefits of the procedure and the sedation                            options and risks were discussed with the patient.                            All questions were answered, and informed consent                            was obtained. Prior Anticoagulants: The patient has                            taken no previous anticoagulant or antiplatelet                            agents. ASA Grade Assessment: II - A patient with                            mild systemic disease. After reviewing the risks                            and benefits, the patient was deemed in  satisfactory condition to undergo the procedure.                           After obtaining informed consent, the colonoscope                            was passed under direct vision. Throughout the                            procedure, the patient's blood pressure, pulse, and                            oxygen saturations were monitored continuously. The                   Colonoscope was introduced through the anus and                            advanced to the the cecum, identified by                            appendiceal orifice and ileocecal valve. The                            ileocecal valve, appendiceal orifice, and rectum                            were photographed. The quality of the bowel                            preparation was excellent. The colonoscopy was                            performed without difficulty. The patient tolerated                            the procedure well. The bowel preparation used was                            SUPREP via split dose instruction. Scope In: 9:30:38 AM Scope Out: 9:45:30 AM Scope Withdrawal Time: 0 hours 9 minutes 55 seconds  Total Procedure Duration: 0 hours 14 minutes 52 seconds  Findings:                 A 1 mm polyp was found in the cecum. The polyp was                            removed with a cold snare. Resection and retrieval                            were complete.                           A single diverticulum was found in the ascending  colon.                           The exam was otherwise without abnormality on                            direct and retroflexion views. Complications:            No immediate complications. Estimated blood loss:                            None. Estimated Blood Loss:     Estimated blood loss: none. Impression:               - One 1 mm polyp in the cecum, removed with a cold                            snare. Resected and retrieved.                           - Diverticulosis in the ascending colon.                           - The examination was otherwise normal on direct                            and retroflexion views. Recommendation:           - Repeat colonoscopy in 5 years for surveillance                            (history of advanced adenoma).                           - Patient has a contact number  available for                            emergencies. The signs and symptoms of potential                            delayed complications were discussed with the                            patient. Return to normal activities tomorrow.                            Written discharge instructions were provided to the                            patient.                           - Resume previous diet.                           - Continue present medications.                           -  Await pathology results. Docia Chuck. Henrene Pastor, MD 11/03/2018 9:52:47 AM This report has been signed electronically.

## 2018-11-03 NOTE — Progress Notes (Signed)
No problems noted in the recovery room. maw 

## 2018-11-04 MED ORDER — BUDESONIDE-FORMOTEROL FUMARATE 160-4.5 MCG/ACT IN AERO: INHALATION_SPRAY | RESPIRATORY_TRACT | 0 refills | Status: DC

## 2018-11-04 MED ORDER — BUDESONIDE-FORMOTEROL FUMARATE 160-4.5 MCG/ACT IN AERO: INHALATION_SPRAY | RESPIRATORY_TRACT | 1 refills | Status: DC

## 2018-11-04 NOTE — Telephone Encounter (Signed)
Pt is calling back [phone]

## 2018-11-04 NOTE — Telephone Encounter (Signed)
SPoke with pt, he requests that we send the Symbicort as brand name only through [facility]. He received t he generic and [facility] advised him to call us. I re-sent the Rx to his pharmacy. He also requested a sample to use because he is on his last inhaler and doesn't want to miss a dose while he awaits his medications to come through the mail. Samples were [laced up front for pt to pick up. Nothing further is needed.

## 2018-11-05 ENCOUNTER — Telehealth: Payer: Self-pay | Admitting: *Deleted

## 2018-11-05 ENCOUNTER — Telehealth: Payer: Self-pay | Admitting: Emergency Medicine

## 2018-11-05 MED ORDER — SYMBICORT 160-4.5 MCG/ACT IN AERO: INHALATION_SPRAY | RESPIRATORY_TRACT | 3 refills | Status: DC

## 2018-11-05 NOTE — Telephone Encounter (Signed)
Spoke with pt, he states the [facility] is still not giving him the generic of symbicort. I resent the Rx to [facility] and marked dispense as written Symbicort (brand) inhaler. I called [facility] to see if they received it but they couldn't see it in their system yet. I will call back to see if they received it in a couple of hours to give it time to process. I called pt to let him know the Rx was sent and if there is any other problems then to reach out to [place] again. Pt understood.

## 2018-11-05 NOTE — Telephone Encounter (Signed)
  Follow up Call-  Call back number 11/03/2018  Post procedure Call Back phone  # [phone]  Permission to leave phone message Yes  Some recent data might be hidden     Patient questions:  Do you have a fever, pain , or abdominal swelling? No. Pain Score  0 *  Have you tolerated food without any problems? Yes.    Have you been able to return to your normal activities? Yes.    Do you have any questions about your discharge instructions: Diet   No. Medications  No. Follow up visit  No.  Do you have questions or concerns about your Care? No.  Actions: * If pain score is 4 or above: No action needed, pain <4.  1. Have you developed a fever since your procedure? no  2.   Have you had an respiratory symptoms (SOB or cough) since your procedure? no  3.   Have you tested positive for COVID 19 since your procedure no  4.   Have you had any family members/close contacts diagnosed with the COVID 19 since your procedure?  no   If any of these questions are a yes, please inquire if patient has been seen by family doctor and route this note to Joylene John, Therapist, sports.

## 2018-11-06 ENCOUNTER — Encounter: Payer: Self-pay | Admitting: Internal Medicine

## 2018-11-07 ENCOUNTER — Telehealth: Payer: Self-pay | Admitting: Emergency Medicine

## 2018-11-07 NOTE — Telephone Encounter (Signed)
Spoke with [facility]. I explained to them that pt would like Brand Symbicort filled for Symbicort. Clarification was made and nothing further is needed.

## 2018-11-17 ENCOUNTER — Telehealth: Payer: Self-pay | Admitting: Adult Health

## 2018-11-17 NOTE — Telephone Encounter (Signed)
Received packet of forms from [place] is requesting completion of this form because [name] has requested an accomodation for his disability.  [employer] is unable to answer any of the questions because they pertain to his medical condition.  Upon inspection of patient's chart, last office visit was 10.4.33 with Dr. Lamonte Sakai in which it is noted that the patient has relocated to [place] since 01/2018.  Per TP: we have not seen patient in the office in 6 months and the [place] forms are from [place].  Would recommend that patient have these forms filled out by the pulmonologist who has been caring for him in [place].  If he does not have one, then he needs to establish.  Forms sent back to [place]. Will sign off.

## 2018-12-02 DIAGNOSIS — J449 Chronic obstructive pulmonary disease, unspecified: Secondary | ICD-10-CM | POA: Diagnosis not present

## 2018-12-02 DIAGNOSIS — D696 Thrombocytopenia, unspecified: Secondary | ICD-10-CM | POA: Diagnosis not present

## 2018-12-02 DIAGNOSIS — F418 Other specified anxiety disorders: Secondary | ICD-10-CM | POA: Diagnosis not present

## 2018-12-02 DIAGNOSIS — Z1331 Encounter for screening for depression: Secondary | ICD-10-CM | POA: Diagnosis not present

## 2018-12-02 DIAGNOSIS — E890 Postprocedural hypothyroidism: Secondary | ICD-10-CM | POA: Diagnosis not present

## 2018-12-05 DIAGNOSIS — E890 Postprocedural hypothyroidism: Secondary | ICD-10-CM | POA: Diagnosis not present

## 2018-12-05 DIAGNOSIS — R202 Paresthesia of skin: Secondary | ICD-10-CM | POA: Diagnosis not present

## 2018-12-05 DIAGNOSIS — D510 Vitamin B12 deficiency anemia due to intrinsic factor deficiency: Secondary | ICD-10-CM | POA: Diagnosis not present

## 2018-12-05 DIAGNOSIS — E559 Vitamin D deficiency, unspecified: Secondary | ICD-10-CM | POA: Diagnosis not present

## 2018-12-05 DIAGNOSIS — I10 Essential (primary) hypertension: Secondary | ICD-10-CM | POA: Diagnosis not present

## 2019-01-01 ENCOUNTER — Telehealth: Payer: Self-pay | Admitting: Emergency Medicine

## 2019-01-01 MED ORDER — SYMBICORT 160-4.5 MCG/ACT IN AERO: INHALATION_SPRAY | RESPIRATORY_TRACT | 0 refills | Status: DC

## 2019-01-01 NOTE — Telephone Encounter (Signed)
Call returned to patient, requesting a sample of symbicort. Made aware sample has been placed upfront for pick up. Aware an appt needs to be made. He voices he will make the appt when he comes by to pick up his sample. Nothing further is needed at this time.

## 2019-01-01 NOTE — Telephone Encounter (Signed)
Attempted to call pt but unable to reach. Left message for pt to return call in regards to the sample pt is requesting.  Pt is also due for an appt with RB so we need to get him scheduled for an appt.

## 2019-01-01 NOTE — Telephone Encounter (Signed)
Pt is calling back [phone]

## 2019-01-07 DIAGNOSIS — I480 Paroxysmal atrial fibrillation: Secondary | ICD-10-CM | POA: Diagnosis not present

## 2019-01-07 DIAGNOSIS — E890 Postprocedural hypothyroidism: Secondary | ICD-10-CM | POA: Diagnosis not present

## 2019-01-07 DIAGNOSIS — D126 Benign neoplasm of colon, unspecified: Secondary | ICD-10-CM | POA: Diagnosis not present

## 2019-01-07 DIAGNOSIS — F418 Other specified anxiety disorders: Secondary | ICD-10-CM | POA: Diagnosis not present

## 2019-01-20 ENCOUNTER — Ambulatory Visit: Payer: Federal, State, Local not specified - PPO | Admitting: Emergency Medicine

## 2019-01-26 DIAGNOSIS — F418 Other specified anxiety disorders: Secondary | ICD-10-CM | POA: Diagnosis not present

## 2019-02-02 ENCOUNTER — Ambulatory Visit: Payer: Federal, State, Local not specified - PPO | Admitting: Emergency Medicine

## 2019-02-02 ENCOUNTER — Encounter: Payer: Self-pay | Admitting: Emergency Medicine

## 2019-02-02 ENCOUNTER — Other Ambulatory Visit: Payer: Self-pay

## 2019-02-02 DIAGNOSIS — J449 Chronic obstructive pulmonary disease, unspecified: Secondary | ICD-10-CM

## 2019-02-02 NOTE — Progress Notes (Signed)
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 Had repeat thyroid ablation (Dr Chalmers Cater), and since then A fib and diastolic dysfxn improved.   I have not seen him since 02/2015. He has been seen recently in our office by T Parrett, treated for a possible acute exacerbation after being exposed to URI. I treated him w pred over the phone 06/24/17. He believes that he improved some, had some more energy, cough has returned and worsened. He is now coughing up dark green, brownish mucous. Also with HA and purulent sinus drainage. Using xopenex nebs about 3x a week.   [name] 03/21/18 --63 year old former smoker with a history of COPD/asthma, severe obstruction.  Last seen in February 2019.  He has been managed on Symbicort and Charlotte Crumb, has Xopenex available to use as needed - rarely.  He reports today that has been in [place], [place], relocated there in 01/2018. He is having sinus sx, rhinitis with the exposures there. I treated him with doxycycline when this happened, started claritin as well. He has improved since then. He is now using the claritin prn. Doesn't want the flu shot or pneumonia shots.   [name] 02/02/2019 --[name] is 63, former smoker with a history of COPD/asthma (severe obstruction.  Last seen just under a year ago.  He has been managed on Symbicort and Charlotte Crumb, has Xopenex to use if needed.  He has intermittently used loratadine for seasonal allergies. He reports that he has been feeling well, no breathing difficulty, he is able to exercise. He has been able to come off Seebri, has not used regularly for months, it was irritating his throat and causing congestion. No flares. No xopenex use.    Vitals:   02/02/19 1512   BP: 120/82  Pulse: 61  Temp: 98.8 F (37.1 C)  TempSrc: Oral  SpO2: 96%  Weight: 181 lb 3.2 oz (82.2 kg)  Height: 5\' 10"  (1.778 m)   Gen: Pleasant, well-nourished, in no distress,  normal affect  ENT: No lesions,  mouth clear,  oropharynx clear, no postnasal drip  Neck: No JVD, no stridor  Lungs: No use of accessory muscles, clear without rales or rhonchi, no wheeze today, no crackles  Cardiovascular: RRR, heart sounds normal, no murmur or gallops, no peripheral edema  Musculoskeletal: No deformities, no cyanosis or clubbing  Neuro: alert, non focal  Skin: Warm, no lesions or rashes   Chest x-ray 07/02/17 COMPARISON:  08/31/2008  FINDINGS: The cardiac silhouette is normal in size and configuration. Normal mediastinal and hilar contours.  There are reticular opacities at both lung bases consistent with scarring or subsegmental atelectasis or a combination. Lungs are hyperexpanded but otherwise clear.  No pleural effusion or pneumothorax.  Skeletal structures are intact.  IMPRESSION: 1. No acute cardiopulmonary disease. 2. Lung base scarring or subsegmental atelectasis. Lung hyperexpansion suggests COPD   COPD with asthma ([place]) Doing well. No flares. He stopped Charlotte Crumb, has tolerated.   Stop Charlotte Crumb Please continue Symbicort 2 puffs twice daily.  Remember to rinse and gargle after using. Keep Xopenex available to use 2 puffs if needed for shortness of breath, chest tightness, wheezing.  We will refill this today. Get the flu shot this fall. Follow with Dr. Lamonte Sakai in 12 months  or sooner if you have any problems.    [name], MD, PhD 02/02/2019, 3:42 PM [facility] [phone] or if no answer [phone]

## 2019-02-02 NOTE — Patient Instructions (Signed)
Stop Charlotte Crumb Please continue Symbicort 2 puffs twice daily.  Remember to rinse and gargle after using. Keep Xopenex available to use 2 puffs if needed for shortness of breath, chest tightness, wheezing.  We will refill this today. Get the flu shot this fall. Follow with Dr. Lamonte Sakai in 12 months or sooner if you have any problems.

## 2019-02-02 NOTE — Assessment & Plan Note (Addendum)
Doing well. No flares. He stopped Charlotte Crumb, has tolerated.   Stop Charlotte Crumb Please continue Symbicort 2 puffs twice daily.  Remember to rinse and gargle after using. Keep Xopenex available to use 2 puffs if needed for shortness of breath, chest tightness, wheezing.  We will refill this today. Get the flu shot this fall. Follow with Dr. Lamonte Sakai in 12 months or sooner if you have any problems.

## 2019-02-16 DIAGNOSIS — F418 Other specified anxiety disorders: Secondary | ICD-10-CM | POA: Diagnosis not present

## 2019-05-08 DIAGNOSIS — M5416 Radiculopathy, lumbar region: Secondary | ICD-10-CM | POA: Diagnosis not present

## 2019-05-08 DIAGNOSIS — K769 Liver disease, unspecified: Secondary | ICD-10-CM | POA: Diagnosis not present

## 2019-05-08 DIAGNOSIS — R918 Other nonspecific abnormal finding of lung field: Secondary | ICD-10-CM | POA: Diagnosis not present

## 2019-05-20 ENCOUNTER — Telehealth: Payer: Self-pay | Admitting: Emergency Medicine

## 2019-05-20 MED ORDER — SYMBICORT 160-4.5 MCG/ACT IN AERO: INHALATION_SPRAY | RESPIRATORY_TRACT | 2 refills | Status: DC

## 2019-05-20 NOTE — Telephone Encounter (Signed)
Rx for Symbicort DAW 90 day supply sent to [facility]  Nothing further needed

## 2019-12-16 ENCOUNTER — Other Ambulatory Visit: Payer: Self-pay | Admitting: Emergency Medicine

## 2020-03-02 ENCOUNTER — Other Ambulatory Visit: Payer: Self-pay

## 2020-03-02 ENCOUNTER — Ambulatory Visit (INDEPENDENT_AMBULATORY_CARE_PROVIDER_SITE_OTHER): Payer: BLUE CROSS/BLUE SHIELD | Admitting: Emergency Medicine

## 2020-03-02 ENCOUNTER — Other Ambulatory Visit: Payer: Self-pay | Admitting: Emergency Medicine

## 2020-03-02 ENCOUNTER — Encounter: Payer: Self-pay | Admitting: Emergency Medicine

## 2020-03-02 DIAGNOSIS — J449 Chronic obstructive pulmonary disease, unspecified: Secondary | ICD-10-CM | POA: Diagnosis not present

## 2020-03-02 MED ORDER — SYMBICORT 160-4.5 MCG/ACT IN AERO: INHALATION_SPRAY | RESPIRATORY_TRACT | 3 refills | Status: DC

## 2020-03-02 MED ORDER — ALBUTEROL SULFATE HFA 108 (90 BASE) MCG/ACT IN AERS: 2.0000 | INHALATION_SPRAY | Freq: Four times a day (QID) | RESPIRATORY_TRACT | 2 refills | Status: DC | PRN

## 2020-03-02 NOTE — Progress Notes (Signed)
History of Present Illness:   [name] 03/02/20 --this follow-up visit for pleasant 64 year old gentleman with a history of COPD/asthma with severe obstruction.  He has been managed on Symbicort since last year. About once every 2 weeks he will will take a dose of Seebri. He doesn't have an albuterol inhaler.  He is interested in getting back to the gym to exercise. No flares or abx, pred.  He has not been COVID vaccinated - he is afraid of side effects, complications.    Vitals:   03/02/20 1612  BP: 126/72  Pulse: 60  Temp: 98.7 F (37.1 C)  TempSrc: Temporal  SpO2: 100%  Weight: 188 lb (85.3 kg)  Height: 5\' 10"  (1.778 m)   Gen: Pleasant, well-nourished, in no distress,  normal affect  ENT: No lesions,  mouth clear,  oropharynx clear, no postnasal drip  Neck: No JVD, no stridor  Lungs: No use of accessory muscles, clear without rales or rhonchi, no wheeze on regular breath or forced exp.   Cardiovascular: RRR, heart sounds normal, no murmur or gallops, no peripheral edema  Musculoskeletal: No deformities, no cyanosis or clubbing  Neuro: alert, non focal  Skin: Warm, no lesions or rashes    COPD with asthma ([facility]) Continue Symbicort 2 puffs twice a day.  Rinse and gargle after using. We will prescribe an albuterol inhaler for you to have available.  Use 2 puffs if you need it for shortness of breath, chest tightness, wheezing. Agree with getting back to your exercise routine Please continue to practice social distancing, wear your mask reliably and clean your hands. You would probably benefit from getting the COVID-19 vaccine.  Let us know if you change your mind about getting it or if you have questions. Follow with Dr. Lamonte Sakai in 12 months or sooner if you have any problems   [name], MD, PhD 03/02/2020, 4:32 PM [facility] [phone] or if no answer [phone]

## 2020-03-02 NOTE — Patient Instructions (Signed)
Continue Symbicort 2 puffs twice a day.  Rinse and gargle after using. We will prescribe an albuterol inhaler for you to have available.  Use 2 puffs if you need it for shortness of breath, chest tightness, wheezing. Agree with getting back to your exercise routine Please continue to practice social distancing, wear your mask reliably and clean your hands. You would probably benefit from getting the COVID-19 vaccine.  Let us know if you change your mind about getting it or if you have questions. Follow with Dr. Lamonte Sakai in 12 months or sooner if you have any problems.

## 2020-03-02 NOTE — Assessment & Plan Note (Signed)
Continue Symbicort 2 puffs twice a day.  Rinse and gargle after using. We will prescribe an albuterol inhaler for you to have available.  Use 2 puffs if you need it for shortness of breath, chest tightness, wheezing. Agree with getting back to your exercise routine Please continue to practice social distancing, wear your mask reliably and clean your hands. You would probably benefit from getting the COVID-19 vaccine.  Let us know if you change your mind about getting it or if you have questions. Follow with Dr. Lamonte Sakai in 12 months or sooner if you have any problems

## 2020-03-08 ENCOUNTER — Telehealth: Payer: Self-pay | Admitting: Emergency Medicine

## 2020-03-08 NOTE — Telephone Encounter (Signed)
Pt returning a phone call. Pt can be reached at [phone].

## 2020-03-08 NOTE — Telephone Encounter (Signed)
Called pt back- line rings and then turns busy

## 2020-03-08 NOTE — Telephone Encounter (Signed)
We do not carry samples of symbicort here anymore  Tried calling the pt and he did not answer and there was no VM  Will call back

## 2020-03-09 NOTE — Telephone Encounter (Signed)
Attempted to call pt but unable to reach and unable to leave VM. Will try to call back later. 

## 2020-03-09 NOTE — Telephone Encounter (Signed)
Spoke with patient regarding prior message . Advised patient that we do not have Symbicort samples in the office anymore.Pateint stated he will be getting new insurance on 10/1/21and may run out of medication by that time. Patient stated he wing it until new insurance starts. Advised patient to call if we can help.Nothing else further needed.

## 2020-03-21 ENCOUNTER — Telehealth: Payer: Self-pay | Admitting: Emergency Medicine

## 2020-03-21 MED ORDER — SYMBICORT 160-4.5 MCG/ACT IN AERO: INHALATION_SPRAY | RESPIRATORY_TRACT | 3 refills | Status: DC

## 2020-03-21 NOTE — Telephone Encounter (Signed)
Spoke with the pt  He has spoken with pharm and they received the rx  Nothing further needed

## 2020-03-21 NOTE — Telephone Encounter (Signed)
rx sent to preferred pharmacy- left detailed msg for pt that this was done

## 2020-03-21 NOTE — Telephone Encounter (Signed)
I already sent in rx this morning and left pt msg letting him know this was done ? Why he is calling back? LMTCB

## 2020-03-21 NOTE — Addendum Note (Signed)
Addended by: Lia Foyer R on: 03/21/2020 02:41 PM   Modules accepted: Orders

## 2020-10-26 ENCOUNTER — Telehealth (HOSPITAL_COMMUNITY): Payer: Self-pay | Admitting: *Deleted

## 2020-10-26 ENCOUNTER — Other Ambulatory Visit (HOSPITAL_COMMUNITY): Payer: Self-pay | Admitting: Endocrinology

## 2020-10-26 DIAGNOSIS — I480 Paroxysmal atrial fibrillation: Secondary | ICD-10-CM

## 2020-10-26 NOTE — Telephone Encounter (Signed)
Close encounter 

## 2020-10-27 ENCOUNTER — Other Ambulatory Visit: Payer: Self-pay

## 2020-10-27 ENCOUNTER — Ambulatory Visit (HOSPITAL_COMMUNITY)
Admission: RE | Admit: 2020-10-27 | Discharge: 2020-10-27 | Disposition: A | Discharge status: Home / Self Care | Payer: No Typology Code available for payment source | Source: Ambulatory Visit | Attending: Cardiology | Admitting: Cardiology

## 2020-10-27 DIAGNOSIS — I480 Paroxysmal atrial fibrillation: Secondary | ICD-10-CM | POA: Diagnosis not present

## 2020-10-27 LAB — MYOCARDIAL PERFUSION IMAGING
LV dias vol: 113 mL (ref 62–150)
LV sys vol: 53 mL
Peak HR: 90 {beats}/min
Rest HR: 51 {beats}/min
SDS: 2
SRS: 1
SSS: 3
TID: 1.1

## 2020-10-27 MED ORDER — REGADENOSON 0.4 MG/5ML IV SOLN
0.4000 mg | Freq: Once | INTRAVENOUS | Status: AC
Start: 2020-10-27 — End: 2020-10-27
  Administered 2020-10-27: 0.4 mg via INTRAVENOUS

## 2020-10-27 MED ORDER — TECHNETIUM TC 99M TETROFOSMIN IV KIT
9.9000 | PACK | Freq: Once | INTRAVENOUS | Status: AC | PRN
  Administered 2020-10-27: 9.9 via INTRAVENOUS
  Filled 2020-10-27: qty 10

## 2020-10-27 MED ORDER — TECHNETIUM TC 99M TETROFOSMIN IV KIT
32.4000 | PACK | Freq: Once | INTRAVENOUS | Status: AC | PRN
  Administered 2020-10-27: 32.4 via INTRAVENOUS
  Filled 2020-10-27: qty 33

## 2020-11-30 ENCOUNTER — Telehealth (HOSPITAL_COMMUNITY): Payer: Self-pay | Admitting: Internal Medicine

## 2020-11-30 NOTE — Telephone Encounter (Signed)
Pt called wanted a sooner appt w/DB in regards to  the results of a stress test , please advise

## 2020-12-02 NOTE — Telephone Encounter (Signed)
Looks normal but if he is still having symptoms we can work him in.

## 2020-12-02 NOTE — Telephone Encounter (Signed)
Pt had myoview 5/12 ordered by Dr Forde Dandy for dyspnea and fatigue. Please review and advise

## 2020-12-06 NOTE — Telephone Encounter (Signed)
Pt asked if it was ok for him to work out at the gym again.  Routed to [place] for advice

## 2020-12-06 NOTE — Telephone Encounter (Signed)
Left detailed vm requesting return call.

## 2020-12-23 DIAGNOSIS — D510 Vitamin B12 deficiency anemia due to intrinsic factor deficiency: Secondary | ICD-10-CM | POA: Diagnosis not present

## 2020-12-23 DIAGNOSIS — E559 Vitamin D deficiency, unspecified: Secondary | ICD-10-CM | POA: Diagnosis not present

## 2020-12-23 DIAGNOSIS — J449 Chronic obstructive pulmonary disease, unspecified: Secondary | ICD-10-CM | POA: Diagnosis not present

## 2020-12-23 DIAGNOSIS — M67432 Ganglion, left wrist: Secondary | ICD-10-CM | POA: Diagnosis not present

## 2020-12-23 DIAGNOSIS — D1803 Hemangioma of intra-abdominal structures: Secondary | ICD-10-CM | POA: Diagnosis not present

## 2020-12-23 DIAGNOSIS — F418 Other specified anxiety disorders: Secondary | ICD-10-CM | POA: Diagnosis not present

## 2020-12-23 DIAGNOSIS — I480 Paroxysmal atrial fibrillation: Secondary | ICD-10-CM | POA: Diagnosis not present

## 2020-12-23 DIAGNOSIS — E890 Postprocedural hypothyroidism: Secondary | ICD-10-CM | POA: Diagnosis not present

## 2020-12-23 DIAGNOSIS — I10 Essential (primary) hypertension: Secondary | ICD-10-CM | POA: Diagnosis not present

## 2020-12-23 DIAGNOSIS — M48061 Spinal stenosis, lumbar region without neurogenic claudication: Secondary | ICD-10-CM | POA: Diagnosis not present

## 2020-12-23 DIAGNOSIS — B1920 Unspecified viral hepatitis C without hepatic coma: Secondary | ICD-10-CM | POA: Diagnosis not present

## 2021-01-30 DIAGNOSIS — M25532 Pain in left wrist: Secondary | ICD-10-CM | POA: Diagnosis not present

## 2021-01-30 DIAGNOSIS — R2230 Localized swelling, mass and lump, unspecified upper limb: Secondary | ICD-10-CM | POA: Diagnosis not present

## 2021-02-07 ENCOUNTER — Other Ambulatory Visit: Payer: Self-pay | Admitting: Emergency Medicine

## 2021-02-08 ENCOUNTER — Telehealth: Payer: Self-pay | Admitting: Emergency Medicine

## 2021-02-08 MED ORDER — SYMBICORT 160-4.5 MCG/ACT IN AERO: INHALATION_SPRAY | RESPIRATORY_TRACT | 0 refills | Status: DC

## 2021-02-08 NOTE — Telephone Encounter (Signed)
Called and spoke with patient. Advised him that the 90 day supply of his Symbicort was sent into the pharmacy. Patient verbalized understanding.  Nothing further needed at this time.

## 2021-02-08 NOTE — Telephone Encounter (Signed)
Sent 30 day supply into pharmacy. Attempted to call and let patient know, go his voice mail.  Nothing further needed at this time.

## 2021-02-08 NOTE — Telephone Encounter (Signed)
Prescription sent into pharmacy.   Nothing further needed at this time.

## 2021-04-27 ENCOUNTER — Ambulatory Visit (INDEPENDENT_AMBULATORY_CARE_PROVIDER_SITE_OTHER): Payer: Medicare Other | Admitting: Emergency Medicine

## 2021-04-27 ENCOUNTER — Other Ambulatory Visit: Payer: Self-pay

## 2021-04-27 ENCOUNTER — Encounter: Payer: Self-pay | Admitting: Emergency Medicine

## 2021-04-27 DIAGNOSIS — J449 Chronic obstructive pulmonary disease, unspecified: Secondary | ICD-10-CM | POA: Diagnosis not present

## 2021-04-27 MED ORDER — ALBUTEROL SULFATE HFA 108 (90 BASE) MCG/ACT IN AERS: 2.0000 | INHALATION_SPRAY | Freq: Four times a day (QID) | RESPIRATORY_TRACT | 2 refills | Status: DC | PRN

## 2021-04-27 MED ORDER — SYMBICORT 160-4.5 MCG/ACT IN AERO: INHALATION_SPRAY | RESPIRATORY_TRACT | 0 refills | Status: DC

## 2021-04-27 NOTE — Progress Notes (Signed)
History of Present Illness:   [name] 03/02/20 --this follow-up visit for pleasant 65 year old gentleman with a history of COPD/asthma with severe obstruction.  He has been managed on Symbicort since last year. About once every 2 weeks he will will take a dose of Seebri. He doesn't have an albuterol inhaler.  He is interested in getting back to the gym to exercise. No flares or abx, pred.  He has not been COVID vaccinated - he is afraid of side effects, complications.   [name] 04/27/21 --this follow-up visit for 65 year old gentleman who has a history of COPD with asthmatic features, severe obstruction.  He is on Symbicort, has albuterol which he uses rarely. Occasionally he will still take a dose of Seebri.  Today he reports that he has been doing fairly well. He tried to go back to the gym - noted that he was quite fatigued. No wheeze or cough. Able to do his usual activities. Has not needed albuterol. He has some throat clearing. He has to clear his throat frequently, no mucous production. No allergy gtt or GERD sx. No flares, no pred or abx.    Vitals:   04/27/21 1547  BP: 132/88  Pulse: 63  SpO2: 96%  Weight: 188 lb 9.6 oz (85.5 kg)  Height: 5\' 10"  (1.778 m)   Gen: Pleasant, well-nourished, in no distress,  normal affect  ENT: No lesions,  mouth clear,  oropharynx clear, no postnasal drip  Neck: No JVD, no stridor  Lungs: No use of accessory muscles, clear without rales or rhonchi, no wheeze on regular breath or forced exp.   Cardiovascular: RRR, heart sounds normal, no murmur or gallops, no peripheral edema  Musculoskeletal: No deformities, no cyanosis or clubbing  Neuro: alert, non focal  Skin: Warm, no lesions or rashes    COPD with asthma ([place]) Doing well.  Overall stable without any flares.  Minimal albuterol use in fact he no longer has SABA.  We will get one for him.  Follow annually or sooner if he has any problems.  Please continue Symbicort 2 puffs twice a day.  Rinse and  gargle after using. We will refill your albuterol.  You can use 2 puffs if you need it for shortness of breath, chest tightness, wheezing. Agree with doing your incentive spirometry and deep breathing exercises. Agree with starting slow steady exercise to work on improving your breathing and your functional capacity. Follow Dr. Lamonte Sakai in 1 year or sooner if you have any problems.   [name], MD, PhD 04/27/2021, 4:21 PM [facility] [phone] or if no answer [phone]

## 2021-04-27 NOTE — Addendum Note (Signed)
Addended by: Konrad Felix L on: 04/27/2021 04:38 PM   Modules accepted: Orders

## 2021-04-27 NOTE — Assessment & Plan Note (Signed)
Doing well.  Overall stable without any flares.  Minimal albuterol use in fact he no longer has SABA.  We will get one for him.  Follow annually or sooner if he has any problems.  Please continue Symbicort 2 puffs twice a day.  Rinse and gargle after using. We will refill your albuterol.  You can use 2 puffs if you need it for shortness of breath, chest tightness, wheezing. Agree with doing your incentive spirometry and deep breathing exercises. Agree with starting slow steady exercise to work on improving your breathing and your functional capacity. Follow Dr. Lamonte Sakai in 1 year or sooner if you have any problems.

## 2021-04-27 NOTE — Patient Instructions (Signed)
Please continue Symbicort 2 puffs twice a day.  Rinse and gargle after using. We will refill your albuterol.  You can use 2 puffs if you need it for shortness of breath, chest tightness, wheezing. Agree with doing your incentive spirometry and deep breathing exercises. Agree with starting slow steady exercise to work on improving your breathing and your functional capacity. Follow Dr. Lamonte Sakai in 1 year or sooner if you have any problems.

## 2021-05-05 DIAGNOSIS — I480 Paroxysmal atrial fibrillation: Secondary | ICD-10-CM | POA: Diagnosis not present

## 2021-05-05 DIAGNOSIS — Z79899 Other long term (current) drug therapy: Secondary | ICD-10-CM | POA: Diagnosis not present

## 2021-05-05 DIAGNOSIS — I10 Essential (primary) hypertension: Secondary | ICD-10-CM | POA: Diagnosis not present

## 2021-05-05 DIAGNOSIS — M48061 Spinal stenosis, lumbar region without neurogenic claudication: Secondary | ICD-10-CM | POA: Diagnosis not present

## 2021-05-05 DIAGNOSIS — D126 Benign neoplasm of colon, unspecified: Secondary | ICD-10-CM | POA: Diagnosis not present

## 2021-05-05 DIAGNOSIS — D1803 Hemangioma of intra-abdominal structures: Secondary | ICD-10-CM | POA: Diagnosis not present

## 2021-05-05 DIAGNOSIS — D696 Thrombocytopenia, unspecified: Secondary | ICD-10-CM | POA: Diagnosis not present

## 2021-05-05 DIAGNOSIS — J449 Chronic obstructive pulmonary disease, unspecified: Secondary | ICD-10-CM | POA: Diagnosis not present

## 2021-05-05 DIAGNOSIS — E890 Postprocedural hypothyroidism: Secondary | ICD-10-CM | POA: Diagnosis not present

## 2021-05-05 DIAGNOSIS — D510 Vitamin B12 deficiency anemia due to intrinsic factor deficiency: Secondary | ICD-10-CM | POA: Diagnosis not present

## 2021-05-05 DIAGNOSIS — F418 Other specified anxiety disorders: Secondary | ICD-10-CM | POA: Diagnosis not present

## 2021-05-05 DIAGNOSIS — E559 Vitamin D deficiency, unspecified: Secondary | ICD-10-CM | POA: Diagnosis not present

## 2021-05-09 ENCOUNTER — Telehealth (HOSPITAL_COMMUNITY): Payer: Self-pay

## 2021-05-09 NOTE — Telephone Encounter (Signed)
Patient came to office today concerned about his Echo results and to make a follow up appointment to be seen.  Please contact patient.

## 2021-05-10 ENCOUNTER — Telehealth: Payer: No Typology Code available for payment source | Admitting: Emergency Medicine

## 2021-05-10 NOTE — Telephone Encounter (Signed)
Called and spoke with patient to let him know that some pts with Medicare use [facility] mail order [facility] and some use [facility] he would either have to call them and see if Symbicort is covered with them and/or how much it would be or to call his insurance to see if they have a preference of which pharmacy to use. He expressed understanding and said he would call everyone and then call us back to let us know what he finds out. Nothing further needed at this time.

## 2021-06-21 ENCOUNTER — Other Ambulatory Visit: Payer: Self-pay | Admitting: *Deleted

## 2021-06-21 MED ORDER — SYMBICORT 160-4.5 MCG/ACT IN AERO: 2.0000 | INHALATION_SPRAY | Freq: Two times a day (BID) | RESPIRATORY_TRACT | 3 refills | Status: DC

## 2021-08-03 ENCOUNTER — Telehealth: Payer: Self-pay | Admitting: Emergency Medicine

## 2021-08-03 NOTE — Telephone Encounter (Signed)
ATC patient x2--line rang for >79min. Received recording that call could not be completed at this time.  Will call back.

## 2021-08-03 NOTE — Telephone Encounter (Signed)
pt came into office, states that he was exposed to some cleaning products and would like something to help with breathing. pt denied congestion but was pounding at his chest like he was having trouble breathing.  pt also would like new RX for albuterol neb solution and also needs supplies for the machine. pt uses [phone]

## 2021-08-04 ENCOUNTER — Other Ambulatory Visit: Payer: Self-pay | Admitting: Pulmonary Disease

## 2021-08-04 NOTE — Progress Notes (Signed)
Patient with increased chest tightness and SOB after exposure to cleaning agent 3 days ago.  He requested albuterol for neb.  This was unavailable at [facility].  Called in rx for burst of prednisone and albuterol HFA.  If symptoms worsen he is to report to the ED.  I asked him to contact clinic next week to report his progress.  He is continuing on symbicort.

## 2021-08-21 ENCOUNTER — Encounter (HOSPITAL_BASED_OUTPATIENT_CLINIC_OR_DEPARTMENT_OTHER): Payer: Self-pay | Admitting: Emergency Medicine

## 2021-08-21 ENCOUNTER — Emergency Department (HOSPITAL_BASED_OUTPATIENT_CLINIC_OR_DEPARTMENT_OTHER)
Admission: EM | Admit: 2021-08-21 | Discharge: 2021-08-21 | Disposition: A | Discharge status: Home / Self Care | Payer: Medicare Other | Attending: Emergency Medicine | Admitting: Emergency Medicine

## 2021-08-21 ENCOUNTER — Other Ambulatory Visit: Payer: Self-pay

## 2021-08-21 ENCOUNTER — Emergency Department (HOSPITAL_BASED_OUTPATIENT_CLINIC_OR_DEPARTMENT_OTHER): Payer: Medicare Other | Admitting: Radiology

## 2021-08-21 DIAGNOSIS — R079 Chest pain, unspecified: Secondary | ICD-10-CM | POA: Diagnosis not present

## 2021-08-21 DIAGNOSIS — J45909 Unspecified asthma, uncomplicated: Secondary | ICD-10-CM | POA: Diagnosis not present

## 2021-08-21 DIAGNOSIS — R0602 Shortness of breath: Secondary | ICD-10-CM | POA: Insufficient documentation

## 2021-08-21 DIAGNOSIS — R0789 Other chest pain: Secondary | ICD-10-CM | POA: Diagnosis not present

## 2021-08-21 DIAGNOSIS — J449 Chronic obstructive pulmonary disease, unspecified: Secondary | ICD-10-CM | POA: Diagnosis not present

## 2021-08-21 DIAGNOSIS — R002 Palpitations: Secondary | ICD-10-CM | POA: Diagnosis not present

## 2021-08-21 DIAGNOSIS — Z7951 Long term (current) use of inhaled steroids: Secondary | ICD-10-CM | POA: Diagnosis not present

## 2021-08-21 LAB — CBC
HCT: 44.9 % (ref 39.0–52.0)
Hemoglobin: 15.7 g/dL (ref 13.0–17.0)
MCH: 30.6 pg (ref 26.0–34.0)
MCHC: 35 g/dL (ref 30.0–36.0)
MCV: 87.5 fL (ref 80.0–100.0)
Platelets: 109 10*3/uL — ABNORMAL LOW (ref 150–400)
RBC: 5.13 MIL/uL (ref 4.22–5.81)
RDW: 12.8 % (ref 11.5–15.5)
WBC: 3.7 10*3/uL — ABNORMAL LOW (ref 4.0–10.5)
nRBC: 0 % (ref 0.0–0.2)

## 2021-08-21 LAB — COMPREHENSIVE METABOLIC PANEL
ALT: 12 U/L (ref 0–44)
AST: 21 U/L (ref 15–41)
Albumin: 4.3 g/dL (ref 3.5–5.0)
Alkaline Phosphatase: 47 U/L (ref 38–126)
Anion gap: 9 (ref 5–15)
BUN: 10 mg/dL (ref 8–23)
CO2: 27 mmol/L (ref 22–32)
Calcium: 9.2 mg/dL (ref 8.9–10.3)
Chloride: 103 mmol/L (ref 98–111)
Creatinine, Ser: 0.88 mg/dL (ref 0.61–1.24)
GFR, Estimated: >60 mL/min (ref >60)
Glucose, Bld: 96 mg/dL (ref 70–99)
Potassium: 4.1 mmol/L (ref 3.5–5.1)
Sodium: 139 mmol/L (ref 135–145)
Total Bilirubin: 1.5 mg/dL — ABNORMAL HIGH (ref 0.3–1.2)
Total Protein: 6.5 g/dL (ref 6.5–8.1)

## 2021-08-21 LAB — TROPONIN I (HIGH SENSITIVITY)
Troponin I (High Sensitivity): 15 ng/L (ref <18)
Troponin I (High Sensitivity): 14 ng/L (ref <18)

## 2021-08-21 LAB — D-DIMER, QUANTITATIVE: D-Dimer, Quant: <0.27 ug/mL-FEU (ref 0.00–0.50)

## 2021-08-21 NOTE — ED Notes (Signed)
Pt refused COVID/FLU/RSV swab ? ?

## 2021-08-21 NOTE — Discharge Instructions (Addendum)
Was a pleasure taking care of you today! ? ?Your labs today were unremarkable.  Your x-ray was notable for COPD.  Your EKG was unremarkable.  Continue with your prescription albuterol, Symbicort as previously prescribed.  Call your pulmonologist and set up a follow-up appointment regarding today's ED visit.  Call your primary care doctor and set up a follow-up appointment regarding today's ED visit.  Return to the emergency department if you are experiencing increasing/worsening chest pain, trouble breathing, worsening symptoms. ?

## 2021-08-21 NOTE — ED Triage Notes (Signed)
Pt arrives to ED with c/o shortness of breath and chest pain. The SOB, CP started x3 weeks ago, he has been on an inhaler and prednisone without relief. The SOB is constant, and the CP is intermittent. The CP is described as sharp and last 30 seconds. The CP is bilateral with radiation to his back. Pt reports he came to the ER today because he experienced dyspnea on exertion and lightheadedness while walking up a flight of steps.  ?

## 2021-08-21 NOTE — ED Provider Notes (Signed)
[facility] DE[facility] Provider Note   CSN: [id number] Arrival date & time: 08/21/21  1249     History  Chief Complaint  Patient presents with   Shortness of Breath   Chest Pain    [name] is a 66 y.o. male who presents to the ED complaining of sternal chest pain onset 3 weeks. His chest pain has been intermittent. Denies recent injury, trauma, heavy lifting, or fall. Has associated shortness of breath and palpitations. Has tried albuterol (used once and discontinued) and prednisone without relief of his symptoms. He continues with his Rx symbicort daily. Denies fever, abdominal pain, cough, sore throat, vomiting. Denies missed doses of his synthroid. Has a prior history of asthma and COPD. Denies history of MI, cardiac catherization, CHF, DVT/PE.    The history is provided by the patient. No language interpreter was used.      Home Medications Prior to Admission medications   Medication Sig Start Date End Date Taking? Authorizing Provider  albuterol (VENTOLIN HFA) 108 (90 Base) MCG/ACT inhaler Inhale 2 puffs into the lungs every 6 (six) hours as needed for wheezing or shortness of breath. 04/27/21   Collene Gobble, MD  levothyroxine (SYNTHROID, LEVOTHROID) 100 MCG tablet Take 100 mcg by mouth. 6 days 1 tab/1 1/2 tab on the 7th day 09/17/13   [provider]  SYMBICORT 160-4.5 MCG/ACT inhaler Inhale 2 puffs into the lungs in the morning and at bedtime. USE 2 INHALATIONS BY MOUTH  INTO THE LUNGS TWICE DAILY 06/21/21   Collene Gobble, MD      Allergies    Atenolol, Other, and Theophyllines    Review of Systems   Review of Systems  Constitutional:  Negative for chills and fever.  HENT:  Negative for congestion and sore throat.   Respiratory:  Positive for shortness of breath. Negative for cough.   Cardiovascular:  Positive for chest pain and palpitations. Negative for leg swelling.  Gastrointestinal:  Negative for abdominal pain, diarrhea, nausea  and vomiting.  All other systems reviewed and are negative.  Physical Exam Updated Vital Signs BP (!) 150/82    Pulse (!) 57    Temp 98 F (36.7 C)    Resp 16    Ht '5\' 10"'$  (1.778 m)    Wt 81.6 kg    SpO2 98%    BMI 25.83 kg/m  Physical Exam Vitals and nursing note reviewed.  Constitutional:      General: He is not in acute distress.    Appearance: He is not diaphoretic.  HENT:     Head: Normocephalic and atraumatic.     Mouth/Throat:     Pharynx: No oropharyngeal exudate.  Eyes:     General: No scleral icterus.    Conjunctiva/sclera: Conjunctivae normal.  Cardiovascular:     Rate and Rhythm: Normal rate and regular rhythm.     Pulses: Normal pulses.     Heart sounds: Normal heart sounds.  Pulmonary:     Effort: Pulmonary effort is normal. No respiratory distress.     Breath sounds: Normal breath sounds. No wheezing.  Chest:     Chest wall: No tenderness.     Comments: No chest wall TTP.  Abdominal:     General: Bowel sounds are normal.     Palpations: Abdomen is soft. There is no mass.     Tenderness: There is no abdominal tenderness. There is no guarding or rebound.  Musculoskeletal:        General: Normal  range of motion.     Cervical back: Normal range of motion and neck supple.     Comments: Strength and sensation intact to bilateral upper and lower extremities. No LE edema noted bilaterally. No calf TTP noted bilaterally.   Skin:    General: Skin is warm and dry.  Neurological:     Mental Status: He is alert.  Psychiatric:        Behavior: Behavior normal.    ED Results / Procedures / Treatments   Labs (all labs ordered are listed, but only abnormal results are displayed) Labs Reviewed  CBC - Abnormal; Notable for the following components:      Result Value   WBC 3.7 (*)    Platelets 109 (*)    All other components within normal limits  COMPREHENSIVE METABOLIC PANEL - Abnormal; Notable for the following components:   Total Bilirubin 1.5 (*)    All other  components within normal limits  D-DIMER, QUANTITATIVE  TROPONIN I (HIGH SENSITIVITY)  TROPONIN I (HIGH SENSITIVITY)    EKG EKG Interpretation  Date/Time:  Monday August 21 2021 13:10:05 EST Ventricular Rate:  74 PR Interval:  144 QRS Duration: 74 QT Interval:  358 QTC Calculation: 397 R Axis:   86 Text Interpretation: Sinus rhythm with Premature atrial complexes Septal infarct , age undetermined Abnormal ECG When compared with ECG of 16-Jul-2013 15:57, Premature atrial complexes are now Present Confirmed by Aletta Edouard [phone]) on 08/21/2021 1:16:28 PM  Radiology DG Chest 2 View  Result Date: 08/21/2021 CLINICAL DATA:  SOB and Chest pain for three weeks EXAM: CHEST - 2 VIEW COMPARISON:  Radiograph 07/02/2017 FINDINGS: Unchanged cardiomediastinal silhouette. No focal airspace consolidation. Unchanged pulmonary hyperinflation and bibasilar scarring. No large pleural effusion. No visible pneumothorax. There is no acute osseous abnormality. IMPRESSION: No evidence of acute cardiopulmonary disease.  Findings of COPD. Electronically Signed   By: Maurine Simmering M.D.   On: 08/21/2021 13:34    Procedures Procedures    Medications Ordered in ED Medications - No data to display  ED Course/ Medical Decision Making/ A&P Clinical Course as of 08/23/21 0033  Mon Aug 21, 2021  1834 Notified by RN staff that patient refused covid/flu swab at this time. [SB]  1834 Discussed discharge treatment plan with patient. Patient agreeable at this time. Pt appears safe for discharge. [SB]  1854 Reevaluated patient and notified of lab and imaging findings.  Patient notes "so there are no blood clots?"  Discussed with patient that on x-ray does not look for blood clots.  We will add on D-dimer at this time.  Patient agreeable at this time. [SB]  1951 Discussed with patient negative D-dimer result.  Discussed discharge treatment plan.  Patient agreeable at this time.  Patient appears safe for discharge. [SB]     Clinical Course User Index [SB] Madisin Hasan A, PA-C                           Medical Decision Making Amount and/or Complexity of Data Reviewed Labs: ordered. Radiology: ordered.   Patient presents to the ED with sternal chest pain x 3 weeks.  Denies recent injury, trauma, heavy lifting or fall.  No prior history of MI, catheterization, DVT/PE. Vital signs stable, patient not hypoxic or tachycardic. On exam patient without chest wall tenderness to palpation.  No lower extremity edema noted bilaterally.  No calf tenderness to palpation noted bilaterally. Otherwise, no acute cardiovascular, respiratory, abdominal findings.  Differential diagnosis includes ACS, aortic dissection, pneumothorax, PE, DVT, PNA.    EKG:  Sinus rhythm with PACs.  No acute ST/T changes.  Labs:  I ordered, and personally interpreted labs.  The pertinent results include:   Initial troponin at 15 and repeat troponin at 14 CBC with WBC 3.7 otherwise unremarkable CMP with slightly elevated total bilirubin otherwise unremarkable D-dimer <0.27  Imaging: I ordered imaging studies including CXR I independently visualized and interpreted imaging which showed: No acute cardiopulmonary findings.  Findings of COPD. I agree with the radiologist interpretation  Test Considered: CTA chest, DVT ultrasound Doppler however exams deferred due to negative D-dimer and patient without tachycardia or hypoxia.   Disposition: Patient presentation suspicious for atypical chest pain.  Doubt ACS, EKG without acute ST/T changes, troponins negative, chest x-ray negative, low suspicion for ACS at this time. Chest x-ray without acute findings, vital signs stable, doubt aortic dissection or pneumothorax at this time.  No risk factors for PE, no unilateral lower extremity swelling, malignancy history, HRT, CHF, recent immobilizations, surgery.  No concern for DVT at this time for PE, D-dimer negative. Case discussed with attending who agrees  with discharge treatment plan at this time. After consideration of the diagnostic results and the patients response to treatment, I feel that the patient would benefit from Discharge home. Discussed importance of maintaining follow up appointment with Pulmonologist. Supportive care and strict return precautions discussed with patient at bedside.  Instructed the patient to follow-up with primary care provider as needed.  Patient acknowledges and verbalized understanding.  Patient appears safe for discharge at this time.  Follow-up as indicated in discharge paperwork.  This chart was dictated using voice recognition software, Dragon. Despite the best efforts of this provider to proofread and correct errors, errors may still occur which can change documentation meaning.  Final Clinical Impression(s) / ED Diagnoses Final diagnoses:  Shortness of breath  Atypical chest pain    Rx / DC Orders ED Discharge Orders     None         Kaiyla Stahly A, PA-C 08/23/21 0034    Regan Lemming, MD 08/23/21 1911

## 2021-09-14 DIAGNOSIS — D126 Benign neoplasm of colon, unspecified: Secondary | ICD-10-CM | POA: Diagnosis not present

## 2021-09-14 DIAGNOSIS — J449 Chronic obstructive pulmonary disease, unspecified: Secondary | ICD-10-CM | POA: Diagnosis not present

## 2021-09-14 DIAGNOSIS — B1920 Unspecified viral hepatitis C without hepatic coma: Secondary | ICD-10-CM | POA: Diagnosis not present

## 2021-09-14 DIAGNOSIS — E559 Vitamin D deficiency, unspecified: Secondary | ICD-10-CM | POA: Diagnosis not present

## 2021-09-14 DIAGNOSIS — D696 Thrombocytopenia, unspecified: Secondary | ICD-10-CM | POA: Diagnosis not present

## 2021-09-14 DIAGNOSIS — I480 Paroxysmal atrial fibrillation: Secondary | ICD-10-CM | POA: Diagnosis not present

## 2021-09-14 DIAGNOSIS — D1803 Hemangioma of intra-abdominal structures: Secondary | ICD-10-CM | POA: Diagnosis not present

## 2021-09-14 DIAGNOSIS — E890 Postprocedural hypothyroidism: Secondary | ICD-10-CM | POA: Diagnosis not present

## 2021-09-14 DIAGNOSIS — I10 Essential (primary) hypertension: Secondary | ICD-10-CM | POA: Diagnosis not present

## 2021-09-14 DIAGNOSIS — F418 Other specified anxiety disorders: Secondary | ICD-10-CM | POA: Diagnosis not present

## 2021-09-14 DIAGNOSIS — M48061 Spinal stenosis, lumbar region without neurogenic claudication: Secondary | ICD-10-CM | POA: Diagnosis not present

## 2021-09-14 DIAGNOSIS — D510 Vitamin B12 deficiency anemia due to intrinsic factor deficiency: Secondary | ICD-10-CM | POA: Diagnosis not present

## 2021-12-08 ENCOUNTER — Emergency Department (HOSPITAL_BASED_OUTPATIENT_CLINIC_OR_DEPARTMENT_OTHER): Payer: Medicare Other | Admitting: Radiology

## 2021-12-08 ENCOUNTER — Emergency Department (HOSPITAL_BASED_OUTPATIENT_CLINIC_OR_DEPARTMENT_OTHER): Payer: Medicare Other

## 2021-12-08 ENCOUNTER — Other Ambulatory Visit: Payer: Self-pay

## 2021-12-08 ENCOUNTER — Emergency Department (HOSPITAL_BASED_OUTPATIENT_CLINIC_OR_DEPARTMENT_OTHER)
Admission: EM | Admit: 2021-12-08 | Discharge: 2021-12-08 | Disposition: A | Discharge status: Home / Self Care | Payer: Medicare Other | Attending: Emergency Medicine | Admitting: Emergency Medicine

## 2021-12-08 DIAGNOSIS — Z7951 Long term (current) use of inhaled steroids: Secondary | ICD-10-CM | POA: Insufficient documentation

## 2021-12-08 DIAGNOSIS — E039 Hypothyroidism, unspecified: Secondary | ICD-10-CM | POA: Diagnosis not present

## 2021-12-08 DIAGNOSIS — S8012XA Contusion of left lower leg, initial encounter: Secondary | ICD-10-CM | POA: Diagnosis not present

## 2021-12-08 DIAGNOSIS — M7989 Other specified soft tissue disorders: Secondary | ICD-10-CM | POA: Diagnosis not present

## 2021-12-08 DIAGNOSIS — I509 Heart failure, unspecified: Secondary | ICD-10-CM | POA: Diagnosis not present

## 2021-12-08 DIAGNOSIS — Z79899 Other long term (current) drug therapy: Secondary | ICD-10-CM | POA: Diagnosis not present

## 2021-12-08 DIAGNOSIS — M79652 Pain in left thigh: Secondary | ICD-10-CM | POA: Diagnosis not present

## 2021-12-08 DIAGNOSIS — M79605 Pain in left leg: Secondary | ICD-10-CM | POA: Diagnosis not present

## 2021-12-08 DIAGNOSIS — W010XXA Fall on same level from slipping, tripping and stumbling without subsequent striking against object, initial encounter: Secondary | ICD-10-CM | POA: Insufficient documentation

## 2021-12-08 DIAGNOSIS — J449 Chronic obstructive pulmonary disease, unspecified: Secondary | ICD-10-CM | POA: Insufficient documentation

## 2021-12-08 DIAGNOSIS — S8992XA Unspecified injury of left lower leg, initial encounter: Secondary | ICD-10-CM | POA: Diagnosis present

## 2021-12-08 NOTE — ED Triage Notes (Signed)
Pt c/o pain in left thigh, reports he tripped and fell last Friday. Denies any other injuries. Pt states pain is severe when he tries to squat.

## 2021-12-08 NOTE — ED Provider Notes (Signed)
[facility] DEPT Provider Note   CSN: [id number] Arrival date & time: 12/08/21  5462     History  Chief Complaint  Patient presents with   Leg Pain    [name] is a 66 y.o. male with history of CHF, COPD, hypothyroidism, thrombocytopenia.  The patient presents to ED for evaluation of left thigh bruising, concern for DVT.  Patient states that 1 week ago today, he had a ground-level fall.  Patient denies hitting his head, losing consciousness, blood thinners.  Patient reports at this time he had sudden onset of left lateral thigh pain.  Patient reports that the pain was well controlled with Tylenol until Wednesday when he began developing bruising to his left medial thigh.  The patient states that since this time the bruising has remained consistent, has not gotten any worse or any better.  The patient reports that he has been taking Aleve for pain relief with good effect.  Patient states that he went to his PCP today concern for blood clot and they redirected him to the ED for management.  Patient denies history of blood clot.  Patient denies chest pain, shortness of breath, left calf pain.   Leg Pain      Home Medications Prior to Admission medications   Medication Sig Start Date End Date Taking? Authorizing Provider  albuterol (VENTOLIN HFA) 108 (90 Base) MCG/ACT inhaler Inhale 2 puffs into the lungs every 6 (six) hours as needed for wheezing or shortness of breath. 04/27/21   Collene Gobble, MD  levothyroxine (SYNTHROID, LEVOTHROID) 100 MCG tablet Take 100 mcg by mouth. 6 days 1 tab/1 1/2 tab on the 7th day 09/17/13   [provider]  SYMBICORT 160-4.5 MCG/ACT inhaler Inhale 2 puffs into the lungs in the morning and at bedtime. USE 2 INHALATIONS BY MOUTH  INTO THE LUNGS TWICE DAILY 06/21/21   Collene Gobble, MD      Allergies    Atenolol, Other, and Theophyllines    Review of Systems   Review of Systems  Respiratory:  Negative for shortness  of breath.   Cardiovascular:  Negative for chest pain.  Musculoskeletal:  Negative for myalgias.  Hematological:        Positive for bruising  All other systems reviewed and are negative.   Physical Exam Updated Vital Signs BP (!) 147/92 (BP Location: Right Arm)   Pulse 66   Temp 97.9 F (36.6 C)   Resp 16   Ht '5\' 10"'$  (1.778 m)   Wt 81.6 kg   SpO2 98%   BMI 25.83 kg/m  Physical Exam Vitals and nursing note reviewed.  Constitutional:      General: He is not in acute distress.    Appearance: Normal appearance. He is not ill-appearing, toxic-appearing or diaphoretic.  HENT:     Head: Normocephalic and atraumatic.     Nose: Nose normal. No congestion.     Mouth/Throat:     Mouth: Mucous membranes are moist.     Pharynx: Oropharynx is clear.  Eyes:     Extraocular Movements: Extraocular movements intact.     Conjunctiva/sclera: Conjunctivae normal.     Pupils: Pupils are equal, round, and reactive to light.  Cardiovascular:     Rate and Rhythm: Normal rate and regular rhythm.  Pulmonary:     Effort: Pulmonary effort is normal.     Breath sounds: Normal breath sounds. No wheezing.  Abdominal:     General: Abdomen is flat. Bowel sounds are  normal.     Palpations: Abdomen is soft.     Tenderness: There is no abdominal tenderness.  Musculoskeletal:     Cervical back: Normal range of motion and neck supple. No tenderness.  Skin:    General: Skin is warm and dry.     Capillary Refill: Capillary refill takes less than 2 seconds.     Findings: Bruising present.       Neurological:     Mental Status: He is alert and oriented to person, place, and time.     ED Results / Procedures / Treatments   Labs (all labs ordered are listed, but only abnormal results are displayed) Labs Reviewed - No data to display  EKG None  Radiology US Venous Img Lower  Left (DVT Study)  Result Date: 12/08/2021 CLINICAL DATA:  Pain medial left thigh EXAM: Left LOWER EXTREMITY VENOUS  DOPPLER ULTRASOUND TECHNIQUE: Gray-scale sonography with compression, as well as color and duplex ultrasound, were performed to evaluate the deep venous system(s) from the level of the common femoral vein through the popliteal and proximal calf veins. COMPARISON:  None Available. FINDINGS: VENOUS Normal compressibility of the common femoral, superficial femoral, and popliteal veins, as well as the visualized calf veins. Visualized portions of profunda femoral vein and great saphenous vein unremarkable. No filling defects to suggest DVT on grayscale or color Doppler imaging. Doppler waveforms show normal direction of venous flow, normal respiratory plasticity and response to augmentation. Limited views of the contralateral common femoral vein are unremarkable. OTHER None. Limitations: none IMPRESSION: There is no evidence of deep venous thrombosis in the left lower extremity. Electronically Signed   By: Elmer Picker M.D.   On: 12/08/2021 20:43   DG Femur Min 2 Views Left  Result Date: 12/08/2021 CLINICAL DATA:  injury, thigh bruising and swelling due to recent fall EXAM: LEFT FEMUR 2 VIEWS COMPARISON:  None Available. FINDINGS: There is no evidence of fracture or other focal bone lesions. Soft tissues are unremarkable. IMPRESSION: Negative. Electronically Signed   By: Frazier Richards M.D.   On: 12/08/2021 18:21    Procedures Procedures   Medications Ordered in ED Medications - No data to display  ED Course/ Medical Decision Making/ A&P                           Medical Decision Making Amount and/or Complexity of Data Reviewed Radiology: ordered.   66 year old male presents to ED for evaluation/concern for blood clot.  Please see HPI for further details.  On examination, the patient's left foot has a 2+ DP pulse.  Patient denies knee pain, ankle pain.  Patient has area of bruising to left medial thigh.  Patient afebrile, nontachycardic.  Patient lung sounds clear bilaterally.  Patient DVT  study is negative for any evidence of DVT.  Patient will be encouraged to continue with Aleve therapy at home and follow-up with PCP for further management.  Patient given return precautions and voiced understanding.  The patient had all of his questions answered to his satisfaction.  The patient is stable at this time for discharge home   Final Clinical Impression(s) / ED Diagnoses Final diagnoses:  Contusion of left lower leg, initial encounter    Rx / DC Orders ED Discharge Orders     None         Lawana Chambers 12/08/21 2101    Luna Fuse, MD 12/16/21 [phone]

## 2022-01-29 DIAGNOSIS — R55 Syncope and collapse: Secondary | ICD-10-CM | POA: Diagnosis not present

## 2022-01-29 DIAGNOSIS — Z8616 Personal history of COVID-19: Secondary | ICD-10-CM | POA: Diagnosis not present

## 2022-01-29 DIAGNOSIS — S0191XA Laceration without foreign body of unspecified part of head, initial encounter: Secondary | ICD-10-CM | POA: Diagnosis not present

## 2022-01-29 DIAGNOSIS — S0101XA Laceration without foreign body of scalp, initial encounter: Secondary | ICD-10-CM | POA: Diagnosis present

## 2022-01-29 DIAGNOSIS — E039 Hypothyroidism, unspecified: Secondary | ICD-10-CM | POA: Diagnosis not present

## 2022-01-29 DIAGNOSIS — W19XXXA Unspecified fall, initial encounter: Secondary | ICD-10-CM | POA: Diagnosis not present

## 2022-01-29 DIAGNOSIS — S066XAD Traumatic subarachnoid hemorrhage with loss of consciousness status unknown, subsequent encounter: Secondary | ICD-10-CM | POA: Diagnosis not present

## 2022-01-29 DIAGNOSIS — I700 Atherosclerosis of aorta: Secondary | ICD-10-CM | POA: Diagnosis not present

## 2022-01-29 DIAGNOSIS — S066X9A Traumatic subarachnoid hemorrhage with loss of consciousness of unspecified duration, initial encounter: Secondary | ICD-10-CM | POA: Diagnosis not present

## 2022-01-29 DIAGNOSIS — Z043 Encounter for examination and observation following other accident: Secondary | ICD-10-CM | POA: Diagnosis not present

## 2022-01-29 DIAGNOSIS — M47814 Spondylosis without myelopathy or radiculopathy, thoracic region: Secondary | ICD-10-CM | POA: Diagnosis not present

## 2022-01-29 DIAGNOSIS — S0003XD Contusion of scalp, subsequent encounter: Secondary | ICD-10-CM | POA: Diagnosis not present

## 2022-01-29 DIAGNOSIS — I110 Hypertensive heart disease with heart failure: Secondary | ICD-10-CM | POA: Diagnosis not present

## 2022-01-29 DIAGNOSIS — S0003XA Contusion of scalp, initial encounter: Secondary | ICD-10-CM | POA: Diagnosis not present

## 2022-01-29 DIAGNOSIS — I5032 Chronic diastolic (congestive) heart failure: Secondary | ICD-10-CM | POA: Diagnosis not present

## 2022-01-29 DIAGNOSIS — Z743 Need for continuous supervision: Secondary | ICD-10-CM | POA: Diagnosis not present

## 2022-01-29 DIAGNOSIS — R40241 Glasgow coma scale score 13-15, unspecified time: Secondary | ICD-10-CM | POA: Diagnosis present

## 2022-01-29 DIAGNOSIS — D1809 Hemangioma of other sites: Secondary | ICD-10-CM | POA: Diagnosis not present

## 2022-01-29 DIAGNOSIS — B181 Chronic viral hepatitis B without delta-agent: Secondary | ICD-10-CM | POA: Diagnosis not present

## 2022-01-29 DIAGNOSIS — I480 Paroxysmal atrial fibrillation: Secondary | ICD-10-CM | POA: Diagnosis not present

## 2022-01-29 DIAGNOSIS — S0093XA Contusion of unspecified part of head, initial encounter: Secondary | ICD-10-CM | POA: Diagnosis not present

## 2022-01-29 DIAGNOSIS — E0500 Thyrotoxicosis with diffuse goiter without thyrotoxic crisis or storm: Secondary | ICD-10-CM | POA: Diagnosis not present

## 2022-01-29 DIAGNOSIS — M8588 Other specified disorders of bone density and structure, other site: Secondary | ICD-10-CM | POA: Diagnosis not present

## 2022-01-29 DIAGNOSIS — S066XAA Traumatic subarachnoid hemorrhage with loss of consciousness status unknown, initial encounter: Secondary | ICD-10-CM | POA: Diagnosis not present

## 2022-01-29 DIAGNOSIS — I248 Other forms of acute ischemic heart disease: Secondary | ICD-10-CM | POA: Diagnosis present

## 2022-01-29 DIAGNOSIS — M2578 Osteophyte, vertebrae: Secondary | ICD-10-CM | POA: Diagnosis not present

## 2022-01-29 DIAGNOSIS — R778 Other specified abnormalities of plasma proteins: Secondary | ICD-10-CM | POA: Diagnosis not present

## 2022-01-29 DIAGNOSIS — M5416 Radiculopathy, lumbar region: Secondary | ICD-10-CM | POA: Diagnosis present

## 2022-01-29 DIAGNOSIS — X58XXXD Exposure to other specified factors, subsequent encounter: Secondary | ICD-10-CM | POA: Diagnosis not present

## 2022-01-29 DIAGNOSIS — Z7951 Long term (current) use of inhaled steroids: Secondary | ICD-10-CM | POA: Diagnosis not present

## 2022-01-29 DIAGNOSIS — J449 Chronic obstructive pulmonary disease, unspecified: Secondary | ICD-10-CM | POA: Diagnosis not present

## 2022-01-29 DIAGNOSIS — S066X0A Traumatic subarachnoid hemorrhage without loss of consciousness, initial encounter: Secondary | ICD-10-CM | POA: Diagnosis not present

## 2022-01-29 DIAGNOSIS — K769 Liver disease, unspecified: Secondary | ICD-10-CM | POA: Diagnosis not present

## 2022-01-29 DIAGNOSIS — B182 Chronic viral hepatitis C: Secondary | ICD-10-CM | POA: Diagnosis not present

## 2022-01-29 DIAGNOSIS — Z87891 Personal history of nicotine dependence: Secondary | ICD-10-CM | POA: Diagnosis not present

## 2022-01-29 DIAGNOSIS — S0990XA Unspecified injury of head, initial encounter: Secondary | ICD-10-CM | POA: Diagnosis not present

## 2022-01-29 DIAGNOSIS — R918 Other nonspecific abnormal finding of lung field: Secondary | ICD-10-CM | POA: Diagnosis not present

## 2022-01-29 DIAGNOSIS — I509 Heart failure, unspecified: Secondary | ICD-10-CM | POA: Diagnosis not present

## 2022-01-29 DIAGNOSIS — R791 Abnormal coagulation profile: Secondary | ICD-10-CM | POA: Diagnosis not present

## 2022-01-29 DIAGNOSIS — W101XXA Fall (on)(from) sidewalk curb, initial encounter: Secondary | ICD-10-CM | POA: Diagnosis not present

## 2022-02-09 DIAGNOSIS — S066X1D Traumatic subarachnoid hemorrhage with loss of consciousness of 30 minutes or less, subsequent encounter: Secondary | ICD-10-CM | POA: Diagnosis not present

## 2022-02-09 DIAGNOSIS — E890 Postprocedural hypothyroidism: Secondary | ICD-10-CM | POA: Diagnosis not present

## 2022-02-09 DIAGNOSIS — R778 Other specified abnormalities of plasma proteins: Secondary | ICD-10-CM | POA: Diagnosis not present

## 2022-02-09 DIAGNOSIS — I10 Essential (primary) hypertension: Secondary | ICD-10-CM | POA: Diagnosis not present

## 2022-02-09 DIAGNOSIS — R55 Syncope and collapse: Secondary | ICD-10-CM | POA: Diagnosis not present

## 2022-02-16 DIAGNOSIS — Z6826 Body mass index (BMI) 26.0-26.9, adult: Secondary | ICD-10-CM | POA: Diagnosis not present

## 2022-02-16 DIAGNOSIS — S066X1A Traumatic subarachnoid hemorrhage with loss of consciousness of 30 minutes or less, initial encounter: Secondary | ICD-10-CM | POA: Diagnosis not present

## 2022-02-21 DIAGNOSIS — H43813 Vitreous degeneration, bilateral: Secondary | ICD-10-CM | POA: Diagnosis not present

## 2022-02-21 DIAGNOSIS — D126 Benign neoplasm of colon, unspecified: Secondary | ICD-10-CM | POA: Diagnosis not present

## 2022-02-21 DIAGNOSIS — E890 Postprocedural hypothyroidism: Secondary | ICD-10-CM | POA: Diagnosis not present

## 2022-02-21 DIAGNOSIS — I480 Paroxysmal atrial fibrillation: Secondary | ICD-10-CM | POA: Diagnosis not present

## 2022-02-21 DIAGNOSIS — H40011 Open angle with borderline findings, low risk, right eye: Secondary | ICD-10-CM | POA: Diagnosis not present

## 2022-02-21 DIAGNOSIS — R778 Other specified abnormalities of plasma proteins: Secondary | ICD-10-CM | POA: Diagnosis not present

## 2022-02-21 DIAGNOSIS — J449 Chronic obstructive pulmonary disease, unspecified: Secondary | ICD-10-CM | POA: Diagnosis not present

## 2022-02-21 DIAGNOSIS — R55 Syncope and collapse: Secondary | ICD-10-CM | POA: Diagnosis not present

## 2022-02-21 DIAGNOSIS — D696 Thrombocytopenia, unspecified: Secondary | ICD-10-CM | POA: Diagnosis not present

## 2022-02-21 DIAGNOSIS — S066X1D Traumatic subarachnoid hemorrhage with loss of consciousness of 30 minutes or less, subsequent encounter: Secondary | ICD-10-CM | POA: Diagnosis not present

## 2022-02-21 DIAGNOSIS — I10 Essential (primary) hypertension: Secondary | ICD-10-CM | POA: Diagnosis not present

## 2022-02-22 ENCOUNTER — Other Ambulatory Visit: Payer: Self-pay | Admitting: Endocrinology

## 2022-02-28 ENCOUNTER — Encounter: Payer: Self-pay | Admitting: Internal Medicine

## 2022-02-28 ENCOUNTER — Ambulatory Visit: Payer: Medicare Other | Attending: Cardiovascular Disease | Admitting: Internal Medicine

## 2022-02-28 ENCOUNTER — Ambulatory Visit: Payer: Medicare Other | Attending: Internal Medicine

## 2022-02-28 VITALS — BP 132/80 | HR 62 | Ht 70.0 in | Wt 184.0 lb

## 2022-02-28 DIAGNOSIS — R002 Palpitations: Secondary | ICD-10-CM | POA: Insufficient documentation

## 2022-02-28 DIAGNOSIS — J449 Chronic obstructive pulmonary disease, unspecified: Secondary | ICD-10-CM | POA: Diagnosis not present

## 2022-02-28 DIAGNOSIS — R55 Syncope and collapse: Secondary | ICD-10-CM | POA: Diagnosis not present

## 2022-02-28 DIAGNOSIS — I10 Essential (primary) hypertension: Secondary | ICD-10-CM | POA: Insufficient documentation

## 2022-02-28 DIAGNOSIS — I480 Paroxysmal atrial fibrillation: Secondary | ICD-10-CM | POA: Insufficient documentation

## 2022-02-28 NOTE — Patient Instructions (Signed)
Medication Instructions:  Your physician recommends that you continue on your current medications as directed. Please refer to the Current Medication list given to you today.  *If you need a refill on your cardiac medications before your next appointment, please call your pharmacy*   Lab Work: NONE If you have labs (blood work) drawn today and your tests are completely normal, you will receive your results only by: [place] (if you have MyChart) OR A paper copy in the mail If you have any lab test that is abnormal or we need to change your treatment, we will call you to review the results.   Testing/Procedures: Your physician has requested that you have an echocardiogram. Echocardiography is a painless test that uses sound waves to create images of your heart. It provides your doctor with information about the size and shape of your heart and how well your heart's chambers and valves are working. This procedure takes approximately one hour. There are no restrictions for this procedure.  Your physician has requested that you wear a heart monitor.    Follow-Up: At [facility], you and your health needs are our priority.  As part of our continuing mission to provide you with exceptional heart care, we have created designated Provider Care Teams.  These Care Teams include your primary Cardiologist (physician) and Advanced Practice Providers (APPs -  Physician Assistants and Nurse Practitioners) who all work together to provide you with the care you need, when you need it.  We recommend signing up for the patient portal called "MyChart".  Sign up information is provided on this After Visit Summary.  MyChart is used to connect with patients for Virtual Visits (Telemedicine).  Patients are able to view lab/test results, encounter notes, upcoming appointments, etc.  Non-urgent messages can be sent to your provider as well.   To learn more about what you can do with MyChart, go to  [email or web address].    Your next appointment:   4 month(s)  The format for your next appointment:   In Person  Provider:   Werner Lean, MD     Other Instructions Bryn Gulling- Long Term Monitor Instructions  Your physician has requested you wear a ZIO patch monitor for 14 days.  This is a single patch monitor. [employer] supplies one patch monitor per enrollment. Additional stickers are not available. Please do not apply patch if you will be having a Nuclear Stress Test,  Echocardiogram, Cardiac CT, MRI, or Chest Xray during the period you would be wearing the  monitor. The patch cannot be worn during these tests. You cannot remove and re-apply the  ZIO XT patch monitor.  Your ZIO patch monitor will be mailed 3 day USPS to your address on file. It may take 3-5 days  to receive your monitor after you have been enrolled.  Once you have received your monitor, please review the enclosed instructions. Your monitor  has already been registered assigning a specific monitor serial # to you.  Billing and Patient Assistance Program Information  We have supplied [employer] with any of your insurance information on file for billing purposes. [employer] offers a sliding scale Patient Assistance Program for patients that do not have  insurance, or whose insurance does not completely cover the cost of the ZIO monitor.  You must apply for the Patient Assistance Program to qualify for this discounted rate.  To apply, please call [employer] at [phone], select option 4, select option 2, ask to apply for  Patient  Assistance Program. Theodore Demark will ask your household income, and how many people  are in your household. They will quote your out-of-pocket cost based on that information.  [employer] will also be able to set up a 73-month interest-free payment plan if needed.  Applying the monitor   Shave hair from upper left chest.  Hold abrader disc by orange tab. Rub abrader in 40 strokes over the  upper left chest as  indicated in your monitor instructions.  Clean area with 4 enclosed alcohol pads. Let dry.  Apply patch as indicated in monitor instructions. Patch will be placed under collarbone on left  side of chest with arrow pointing upward.  Rub patch adhesive wings for 2 minutes. Remove white label marked "1". Remove the white  label marked "2". Rub patch adhesive wings for 2 additional minutes.  While looking in a mirror, press and release button in center of patch. A small green light will  flash 3-4 times. This will be your only indicator that the monitor has been turned on.  Do not shower for the first 24 hours. You may shower after the first 24 hours.  Press the button if you feel a symptom. You will hear a small click. Record Date, Time and  Symptom in the Patient Logbook.  When you are ready to remove the patch, follow instructions on the last 2 pages of Patient  Logbook. Stick patch monitor onto the last page of Patient Logbook.  Place Patient Logbook in the blue and white box. Use locking tab on box and tape box closed  securely. The blue and white box has prepaid postage on it. Please place it in the mailbox as  soon as possible. Your physician should have your test results approximately 7 days after the  monitor has been mailed back to [facility]  Call IMetterat [phone] you have questions regarding  your ZIO XT patch monitor. Call them immediately if you see an orange light blinking on your  monitor.  If your monitor falls off in less than 4 days, contact our Monitor department at [phone]  If your monitor becomes loose or falls off after 4 days call [employer] at [phone]  suggestions on securing your monitor   Important Information About Sugar

## 2022-02-28 NOTE — Progress Notes (Unsigned)
Enrolled for [employer] to mail a ZIO XT long term holter monitor to the patients address on file.  

## 2022-02-28 NOTE — Progress Notes (Signed)
Cardiology Office Note:    Date:  02/28/2022   ID:  Marquette Old, DOB 12/21/1955, MRN [id number]  PCP:  Reynold Bowen, [place] [facility] Cardiologist:  Werner Lean, MD     Referring MD: Reynold Bowen, MD   CC: Syncope Consulted for the evaluation of syncope at the behest of Dr. Forde Dandy  History of Present Illness:    Quinterrius Errington is a 66 y.o. male with a hx of life threatening hyperthyroidism HFpEF, AF without ablation 2010, and COPD who presents after syncopal episode.  Has been some what lost to follow up since 2020 tele-visit from the pandemic.  Patient notes that he is feeling episodes of lightheadedness.   He was at work and felt like he was going to grab the fence to keep him up.  Eyes rolled in the back of his head and he passed out.   He feels this every one in a while.  Usually occurs when standing.    Has had no chest pain, chest pressure, chest tightness, chest stinging  Had chest soreness on Sunday.  After taking a shower had no further symptoms.  Patient exertion notable for walking but is rather sedentary No exertional symptoms.    No shortness of breath, DOE .  No PND or orthopnea.  Rarely needs to take a deep breath to breath normally.  No weight gain, leg swelling , or abdominal swelling.  Rarely feels palpitations that improve with shortness of breath.   Had palpitations in the past with atrial fibrillation.    Ambulatory: 130-150s   Past Medical History:  Diagnosis Date   CHF (congestive heart failure) (HCC)    Chronic hepatitis C (HCC)    COPD (chronic obstructive pulmonary disease) (HCC)    Hyperthyroidism    Syncope and collapse    Thrombocytopenia (HCC)     Past Surgical History:  Procedure Laterality Date   COLONOSCOPY  10/27/2015   POLYPECTOMY      Current Medications: Current Meds  Medication Sig   albuterol (VENTOLIN HFA) 108 (90 Base) MCG/ACT inhaler Inhale 2 puffs into the lungs every 6 (six) hours  as needed for wheezing or shortness of breath.   Cholecalciferol 50 MCG (2000 UT) TABS daily.   Cyanocobalamin (VITAMIN B-12) 5000 MCG SUBL daily.   levothyroxine (SYNTHROID, LEVOTHROID) 100 MCG tablet Take 100 mcg by mouth. 6 days 1 tab/1 1/2 tab on the 7th day   SYMBICORT 160-4.5 MCG/ACT inhaler Inhale 2 puffs into the lungs in the morning and at bedtime. USE 2 INHALATIONS BY MOUTH  INTO THE LUNGS TWICE DAILY   Zinc Gluconate 100 MG TABS Take by mouth daily.     Allergies:   Atenolol, Methimazole, Other, and Theophyllines   Social History   Socioeconomic History   Marital status: Married    Spouse name: Not on file   Number of children: Not on file   Years of education: Not on file   Highest education level: Not on file  Occupational History   Not on file  Tobacco Use   Smoking status: Former    Packs/day: 1.00    Years: 20.00    Total pack years: 20.00    Types: Cigarettes    Quit date: 06/18/1986    Years since quitting: 35.7   Smokeless tobacco: Never  Vaping Use   Vaping Use: Never used  Substance and Sexual Activity   Alcohol use: No   Drug use: No   Sexual activity: Not  on file  Other Topics Concern   Not on file  Social History Narrative   Not on file   Social Determinants of Health   Financial Resource Strain: Not on file  Food Insecurity: Not on file  Transportation Needs: Not on file  Physical Activity: Not on file  Stress: Not on file  Social Connections: Not on file    Family History: The patient's family history is negative for Colon cancer, Colon polyps, Esophageal cancer, Rectal cancer, and Stomach cancer.  ROS:   Please see the history of present illness.     All other systems reviewed and are negative.  EKGs/Labs/Other Studies Reviewed:    The following studies were reviewed today:  EKG:  EKG is  ordered today.  The ekg ordered today demonstrates  02/28/22: SR with septal infract pattern    GATED SPECT MYO PERF W/LEXISCAN STRESS 1D  10/27/2020  Narrative  The left ventricular ejection fraction is mildly decreased (45-54%).  Nuclear stress EF: 53%.  There was no ST segment deviation noted during stress.  There is a small defect of moderate severity present in the basal inferior and apex location. The defect is non-reversible. Given normal wall motion this is consistent with attenuation artifact and increased extra cardiac activity. No ischemia noted.  This is a low risk study.     Recent Labs: 08/21/2021: ALT 12; BUN 10; Creatinine, Ser 0.88; Hemoglobin 15.7; Platelets 109; Potassium 4.1; Sodium 139  Recent Lipid Panel No results found for: "CHOL", "TRIG", "HDL", "CHOLHDL", "VLDL", "LDLCALC", "LDLDIRECT"      Physical Exam:    VS:  BP 132/80   Pulse 62   Ht '5\' 10"'$  (1.778 m)   Wt 184 lb (83.5 kg)   SpO2 96%   BMI 26.40 kg/m     Orthostatic Vitals: Supine:  BP 132/80  HR 61 Sitting:  BP 149/86  HR 60 Standing:  BP 135/85  HR 65 Prolonged Stand:  BP 149/87  HR 64  Wt Readings from Last 3 Encounters:  02/28/22 184 lb (83.5 kg)  12/08/21 180 lb (81.6 kg)  08/21/21 180 lb (81.6 kg)     Gen: no distress  Neck: No JVD,  Cardiac: No Rubs or Gallops, no Murmur, RRR +2 radial pulses Respiratory: Clear to auscultation bilaterally, normal effort, normal  respiratory rate GI: Soft, nontender, non-distended  MS: No  edema;  moves all extremities Integument: Skin feels warm Neuro:  At time of evaluation, alert and oriented to person/place/time/situation  Psych: Normal affect, patient feels well   ASSESSMENT:    1. Paroxysmal atrial fibrillation (HCC)   2. Near syncope   3. Palpitations   4. HYPERTENSION, BENIGN   5. COPD with asthma ([place])    PLAN:    New palpitations with hx of PAF s/p AF ablation Near syncope Hx of thyroid storm and heart failure History of COPD HTN - clinically suggestive of vasovagal syncope - AMB above goal - given his arrhythmia history will place Ziopatch; low threshold  to add coreg 3.125 mg PO BID for suppression (has low heart rate room) - will repeat echo - he notes he was going to go to the gym after 2022 (5/22 normal lexixcan) but had terrible experience with adenosine.  If persistent sx will do exercise NM Stress test  Winter f/u with me or APP        Medication Adjustments/Labs and Tests Ordered: Current medicines are reviewed at length with the patient today.  Concerns regarding medicines are  outlined above.  Orders Placed This Encounter  Procedures   LONG TERM MONITOR (3-14 DAYS)   ECHOCARDIOGRAM COMPLETE   No orders of the defined types were placed in this encounter.   Patient Instructions  Medication Instructions:  Your physician recommends that you continue on your current medications as directed. Please refer to the Current Medication list given to you today.  *If you need a refill on your cardiac medications before your next appointment, please call your pharmacy*   Lab Work: NONE If you have labs (blood work) drawn today and your tests are completely normal, you will receive your results only by: [place] (if you have MyChart) OR A paper copy in the mail If you have any lab test that is abnormal or we need to change your treatment, we will call you to review the results.   Testing/Procedures: Your physician has requested that you have an echocardiogram. Echocardiography is a painless test that uses sound waves to create images of your heart. It provides your doctor with information about the size and shape of your heart and how well your heart's chambers and valves are working. This procedure takes approximately one hour. There are no restrictions for this procedure.  Your physician has requested that you wear a heart monitor.    Follow-Up: At [facility], you and your health needs are our priority.  As part of our continuing mission to provide you with exceptional heart care, we have created designated  Provider Care Teams.  These Care Teams include your primary Cardiologist (physician) and Advanced Practice Providers (APPs -  Physician Assistants and Nurse Practitioners) who all work together to provide you with the care you need, when you need it.  We recommend signing up for the patient portal called "MyChart".  Sign up information is provided on this After Visit Summary.  MyChart is used to connect with patients for Virtual Visits (Telemedicine).  Patients are able to view lab/test results, encounter notes, upcoming appointments, etc.  Non-urgent messages can be sent to your provider as well.   To learn more about what you can do with MyChart, go to [email or web address].    Your next appointment:   4 month(s)  The format for your next appointment:   In Person  Provider:   Werner Lean, MD     Other Instructions Bryn Gulling- Long Term Monitor Instructions  Your physician has requested you wear a ZIO patch monitor for 14 days.  This is a single patch monitor. [employer] supplies one patch monitor per enrollment. Additional stickers are not available. Please do not apply patch if you will be having a Nuclear Stress Test,  Echocardiogram, Cardiac CT, MRI, or Chest Xray during the period you would be wearing the  monitor. The patch cannot be worn during these tests. You cannot remove and re-apply the  ZIO XT patch monitor.  Your ZIO patch monitor will be mailed 3 day USPS to your address on file. It may take 3-5 days  to receive your monitor after you have been enrolled.  Once you have received your monitor, please review the enclosed instructions. Your monitor  has already been registered assigning a specific monitor serial # to you.  Billing and Patient Assistance Program Information  We have supplied [employer] with any of your insurance information on file for billing purposes. [employer] offers a sliding scale Patient Assistance Program for patients that do not have  insurance, or  whose insurance does not completely cover the cost  of the ZIO monitor.  You must apply for the Patient Assistance Program to qualify for this discounted rate.  To apply, please call [employer] at [phone], select option 4, select option 2, ask to apply for  Patient Assistance Program. Theodore Demark will ask your household income, and how many people  are in your household. They will quote your out-of-pocket cost based on that information.  [employer] will also be able to set up a 36-month interest-free payment plan if needed.  Applying the monitor   Shave hair from upper left chest.  Hold abrader disc by orange tab. Rub abrader in 40 strokes over the upper left chest as  indicated in your monitor instructions.  Clean area with 4 enclosed alcohol pads. Let dry.  Apply patch as indicated in monitor instructions. Patch will be placed under collarbone on left  side of chest with arrow pointing upward.  Rub patch adhesive wings for 2 minutes. Remove white label marked "1". Remove the white  label marked "2". Rub patch adhesive wings for 2 additional minutes.  While looking in a mirror, press and release button in center of patch. A small green light will  flash 3-4 times. This will be your only indicator that the monitor has been turned on.  Do not shower for the first 24 hours. You may shower after the first 24 hours.  Press the button if you feel a symptom. You will hear a small click. Record Date, Time and  Symptom in the Patient Logbook.  When you are ready to remove the patch, follow instructions on the last 2 pages of Patient  Logbook. Stick patch monitor onto the last page of Patient Logbook.  Place Patient Logbook in the blue and white box. Use locking tab on box and tape box closed  securely. The blue and white box has prepaid postage on it. Please place it in the mailbox as  soon as possible. Your physician should have your test results approximately 7 days after the  monitor has been mailed  [facility] to [facility]  Call [place] [phone] you have questions regarding  your ZIO XT patch monitor. Call them immediately if you see an orange light blinking on your  monitor.  If your monitor falls off in less than 4 days, contact our Monitor department at [phone]  If your monitor becomes loose or falls off after 4 days call [employer] at [phone]  suggestions on securing your monitor   Important Information About Sugar         Signed, MWerner Lean MD  02/28/2022 3:54 PM    [place]

## 2022-03-01 DIAGNOSIS — R55 Syncope and collapse: Secondary | ICD-10-CM | POA: Diagnosis not present

## 2022-03-01 DIAGNOSIS — R002 Palpitations: Secondary | ICD-10-CM | POA: Diagnosis not present

## 2022-03-01 DIAGNOSIS — I480 Paroxysmal atrial fibrillation: Secondary | ICD-10-CM | POA: Diagnosis not present

## 2022-03-01 NOTE — Addendum Note (Signed)
Addended by: Drue Novel I on: 03/01/2022 09:00 PM   Modules accepted: Orders

## 2022-03-07 ENCOUNTER — Ambulatory Visit (INDEPENDENT_AMBULATORY_CARE_PROVIDER_SITE_OTHER): Payer: Medicare Other | Admitting: Emergency Medicine

## 2022-03-07 ENCOUNTER — Encounter: Payer: Self-pay | Admitting: Emergency Medicine

## 2022-03-07 DIAGNOSIS — J449 Chronic obstructive pulmonary disease, unspecified: Secondary | ICD-10-CM | POA: Diagnosis not present

## 2022-03-07 DIAGNOSIS — R918 Other nonspecific abnormal finding of lung field: Secondary | ICD-10-CM

## 2022-03-07 NOTE — Patient Instructions (Addendum)
Please bring [place] a copy of the images from your CT scan of the chest that you have on disc from [place].  We will review.  If we need any more information from [place] then we will get your written permission to obtain these. Continue Symbicort 2 puffs twice daily.  Rinse and gargle after using. Keep your albuterol available to use 2 puffs if you needed for shortness of breath, chest tightness, wheezing. Follow with neurology as planned. Follow with Dr Lamonte Sakai in 6 months or sooner if you have any problems

## 2022-03-07 NOTE — Assessment & Plan Note (Signed)
He was hospitalized for syncope in [place] last month.  Apparently part of his evaluation included a CT scan of the chest.  He was told that he had pulmonary nodules on that CT.  The only comparison CT that I have for him showed subpleural nodule back in 2010.  We will obtain a copy of his CT.  He has a disc that he will bring for review.  If this does not have the scan then we will get his permission to obtain from [place].  Depending on comparison we will decide if any repeat scans, work-up is appropriate

## 2022-03-07 NOTE — Assessment & Plan Note (Signed)
Overall well controlled without any flares.  Rare albuterol use.  He is on Symbicort.  He just changed his insurance to [employer] and the co-pay has increased significantly.  Is going to switch back to [facility] in January.  We will try to assist him, supplement his supply with samples if we have these available.  Flu shot was offered and he declined.

## 2022-03-07 NOTE — Progress Notes (Signed)
History of Present Illness:   [name] 04/27/21 --this follow-up visit for 66 year old gentleman who has a history of COPD with asthmatic features, severe obstruction.  He is on Symbicort, has albuterol which he uses rarely. Occasionally he will still take a dose of [place].  Today he reports that he has been doing fairly well. He tried to go back to the gym - noted that he was quite fatigued. No wheeze or cough. Able to do his usual activities. Has not needed albuterol. He has some throat clearing. He has to clear his throat frequently, no mucous production. No allergy gtt or GERD sx. No flares, no pred or abx.   [name] 03/07/2022 --[name] is 49, returns today for follow-up of history of COPD/asthma.  Hx hyperthyroidism, A fib/ablation. He has severe obstruction on pulmonary function testing.  He remains active.  Has been managed on Symbicort, uses albuterol very rarely.  He feels that his breathing is doing well - he had mild COVID in August, mainly was fatigued. No breathing sx with this.  He had an episode of chest discomfort that began in mid February after he was exposed to cleaning solutions.  Ultimately seen in the ED 08/21/2021 and had a reassuring evaluation.  Today he reports that he had a syncopal episode in in [place], trauma and was hospitalized. He had a Ct chest that made mention of pulmonary nodular disease. I don't have those films available right now, but he has a copy of the disk at home. He is following up w Neurology.    Vitals:   03/07/22 0953  BP: 124/70  Pulse: 61  Temp: 98 F (36.7 C)  TempSrc: Oral  SpO2: 98%  Weight: 186 lb (84.4 kg)  Height: '5\' 10"'$  (1.778 m)    Gen: Pleasant, well-nourished, in no distress,  normal affect  ENT: No lesions,  mouth clear,  oropharynx clear, no postnasal drip  Neck: No JVD, no stridor  Lungs: No use of accessory muscles, clear without rales or rhonchi, no wheeze on regular breath or forced exp.   Cardiovascular: RRR, heart sounds  normal, no murmur or gallops, no peripheral edema  Musculoskeletal: No deformities, no cyanosis or clubbing  Neuro: alert, non focal  Skin: Warm, no lesions or rashes    Pulmonary nodules He was hospitalized for syncope in [place] last month.  Apparently part of his evaluation included a CT scan of the chest.  He was told that he had pulmonary nodules on that CT.  The only comparison CT that I have for him showed subpleural nodule back in 2010.  We will obtain a copy of his CT.  He has a disc that he will bring for review.  If this does not have the scan then we will get his permission to obtain from [place].  Depending on comparison we will decide if any repeat scans, work-up is appropriate  COPD with asthma ([place]) Overall well controlled without any flares.  Rare albuterol use.  He is on Symbicort.  He just changed his insurance to [facility] and the co-pay has increased significantly.  Is going to switch back to [facility] in January.  We will try to assist him, supplement his supply with samples if we have these available.  Flu shot was offered and he declined.     [name], MD, PhD 03/07/2022, 10:12 AM [facility] [phone] or if no answer [phone]

## 2022-03-09 ENCOUNTER — Ambulatory Visit (HOSPITAL_COMMUNITY): Payer: Medicare Other | Attending: Internal Medicine

## 2022-03-09 DIAGNOSIS — R002 Palpitations: Secondary | ICD-10-CM | POA: Insufficient documentation

## 2022-03-09 DIAGNOSIS — R55 Syncope and collapse: Secondary | ICD-10-CM | POA: Diagnosis not present

## 2022-03-09 DIAGNOSIS — I480 Paroxysmal atrial fibrillation: Secondary | ICD-10-CM | POA: Insufficient documentation

## 2022-03-09 LAB — ECHOCARDIOGRAM COMPLETE
Area-P 1/2: 3.53 cm2
S' Lateral: 2.46 cm

## 2022-03-12 ENCOUNTER — Other Ambulatory Visit: Payer: Self-pay | Admitting: Endocrinology

## 2022-03-12 DIAGNOSIS — R55 Syncope and collapse: Secondary | ICD-10-CM

## 2022-03-12 DIAGNOSIS — S066X1D Traumatic subarachnoid hemorrhage with loss of consciousness of 30 minutes or less, subsequent encounter: Secondary | ICD-10-CM

## 2022-03-13 ENCOUNTER — Ambulatory Visit
Admission: RE | Admit: 2022-03-13 | Discharge: 2022-03-13 | Disposition: A | Discharge status: Home / Self Care | Payer: Medicare Other | Source: Ambulatory Visit | Attending: Endocrinology | Admitting: Endocrinology

## 2022-03-13 DIAGNOSIS — S066X1D Traumatic subarachnoid hemorrhage with loss of consciousness of 30 minutes or less, subsequent encounter: Secondary | ICD-10-CM

## 2022-03-13 DIAGNOSIS — S0990XA Unspecified injury of head, initial encounter: Secondary | ICD-10-CM | POA: Diagnosis not present

## 2022-03-13 DIAGNOSIS — R55 Syncope and collapse: Secondary | ICD-10-CM

## 2022-03-16 ENCOUNTER — Ambulatory Visit: Payer: Medicare Other | Admitting: Cardiovascular Disease

## 2022-03-19 ENCOUNTER — Ambulatory Visit (INDEPENDENT_AMBULATORY_CARE_PROVIDER_SITE_OTHER): Payer: Medicare HMO | Admitting: Psychiatry

## 2022-03-19 ENCOUNTER — Telehealth: Payer: Self-pay

## 2022-03-19 ENCOUNTER — Encounter: Payer: Self-pay | Admitting: *Deleted

## 2022-03-19 VITALS — BP 141/83 | HR 65 | Ht 70.0 in | Wt 185.0 lb

## 2022-03-19 DIAGNOSIS — G44319 Acute post-traumatic headache, not intractable: Secondary | ICD-10-CM

## 2022-03-19 DIAGNOSIS — R519 Headache, unspecified: Secondary | ICD-10-CM

## 2022-03-19 DIAGNOSIS — M5412 Radiculopathy, cervical region: Secondary | ICD-10-CM

## 2022-03-19 DIAGNOSIS — R29818 Other symptoms and signs involving the nervous system: Secondary | ICD-10-CM | POA: Diagnosis not present

## 2022-03-19 MED ORDER — TOPIRAMATE 25 MG PO TABS: ORAL_TABLET | ORAL | 6 refills | Status: DC

## 2022-03-19 NOTE — Progress Notes (Signed)
[facility]  PATIENT: [name] DOB: 12/24/1955  REFERRING CLINICIAN: Reynold Bowen, MD HISTORY FROM: self REASON FOR VISIT: syncope   HISTORICAL  CHIEF COMPLAINT:  Chief Complaint  Patient presents with   New Patient (Initial Visit)    He states he has been having really bad headaches from the last past week and a half. He state he has been waking up with headaches and going to bed with headaches. He reports having dizziness when he stands up. He reports taking [place] Powder, Tylenol and Motrin PM which has not been giving him relief but nauseated. He reports feeling pressure in head and hurts to touch. Headaches occur on the left side of head, neck, ears and eyes. Also reports having blurred vision. Room 1 alone     HISTORY OF PRESENT ILLNESS:  The patient presents for evaluation of syncope and headaches.  On 01/29/22 when he passed out and collapsed at a funeral. Eyes rolled to the back of his head and he fell backwards. No tongue biting, shaking, or loss of continence. He was slightly confused for a few minutes afterwards. Notes that he had COVID the week before this and felt very low energy. He presented to the ED where [name] showed a traumatic right occipital SAH. Repeat CTH in the hospital showed resolving SAH.   Since the hospitalization he has had daily severe headaches. They usually wake him up in the middle of the night. Headaches are described as left-sided throbbing behind his eye, behind his ear, and around his neck. His scalp is sensitive on the left side. Headaches are associated with photophobia, no nausea. Left eye will get blurry and tear up. He will feel restless and has to get up. They last for about an hour, then resolve, then will often reoccur later that day. He has 2-3 headaches per day. He has tried [place] powder, Tylenol, and Motrin without improvement. Tried a muscle relaxer one night, but did not find this helpful. He also has spinning  and a "swimming" sensation when he lies his head back on the pillow.  Since the fall he has had neck pain which shoots down bilateral shoulders and upper arms. Has also noticed decreased sensation of his right upper extremity. Denies upper extremity weakness.  [employer] 03/13/22 showed chronic microvascular disease and was otherwise unremarkable  Saw Cardiology who believed his syncope was vasovagal in nature. He was placed on a Zio patch to assess for arrhythmia given history of afib.  OTHER MEDICAL CONDITIONS: Traumatic SAH, Graves disease, afib, CHF, pulmonary hypertension, asthma, COPD   REVIEW OF SYSTEMS: Full 14 system review of systems performed and negative with exception of: headaches, syncope, arm numbness  ALLERGIES: Allergies  Allergen Reactions   Atenolol     Other reaction(s): Other   Methimazole     Other reaction(s): Unknown   Other Other (See Comments)    "Med for hypothyroidism. Killed white blood cells."--?Atenol   Theophyllines     HOME MEDICATIONS: Outpatient Medications Prior to Visit  Medication Sig Dispense Refill   albuterol (VENTOLIN HFA) 108 (90 Base) MCG/ACT inhaler Inhale 2 puffs into the lungs every 6 (six) hours as needed for wheezing or shortness of breath. 8 g 2   Cyanocobalamin (VITAMIN B-12) 5000 MCG SUBL daily.     levothyroxine (SYNTHROID, LEVOTHROID) 100 MCG tablet Take 100 mcg by mouth. 6 days 1 tab/1 1/2 tab on the 7th day     SYMBICORT 160-4.5 MCG/ACT inhaler Inhale 2 puffs into the  lungs in the morning and at bedtime. USE 2 INHALATIONS BY MOUTH  INTO THE LUNGS TWICE DAILY 30.6 g 3   Zinc Gluconate 100 MG TABS Take by mouth daily.     Cholecalciferol 50 MCG (2000 UT) TABS daily. (Patient not taking: Reported on 03/19/2022)     No facility-administered medications prior to visit.    PAST MEDICAL HISTORY: Past Medical History:  Diagnosis Date   CHF (congestive heart failure) (HCC)    Chronic hepatitis C (HCC)    COPD (chronic obstructive  pulmonary disease) (HCC)    Dizziness    Hepatitis C    Hyperthyroidism    Syncope and collapse    Thrombocytopenia (HCC)     PAST SURGICAL HISTORY: Past Surgical History:  Procedure Laterality Date   COLONOSCOPY  10/27/2015   POLYPECTOMY      FAMILY HISTORY: Family History  Problem Relation Age of Onset   Diabetes Mother    Colon cancer Neg Hx    Colon polyps Neg Hx    Esophageal cancer Neg Hx    Rectal cancer Neg Hx    Stomach cancer Neg Hx     SOCIAL HISTORY: Social History   Socioeconomic History   Marital status: Married    Spouse name: Not on file   Number of children: 6   Years of education: Not on file   Highest education level: Not on file  Occupational History   Not on file  Tobacco Use   Smoking status: Former    Packs/day: 1.00    Years: 20.00    Total pack years: 20.00    Types: Cigarettes    Quit date: 06/18/1986    Years since quitting: 35.7   Smokeless tobacco: Never  Vaping Use   Vaping Use: Never used  Substance and Sexual Activity   Alcohol use: No   Drug use: No   Sexual activity: Not on file  Other Topics Concern   Not on file  Social History Narrative   Lives with wife   Caffeine 2 a day   Social Determinants of Health   Financial Resource Strain: Not on file  Food Insecurity: Not on file  Transportation Needs: Not on file  Physical Activity: Not on file  Stress: Not on file  Social Connections: Not on file  Intimate Partner Violence: Not on file     PHYSICAL EXAM  GENERAL EXAM/CONSTITUTIONAL: Vitals:  Vitals:   03/19/22 1441  BP: (!) 141/83  Pulse: 65  Weight: 185 lb (83.9 kg)  Height: '5\' 10"'$  (1.778 m)   Body mass index is 26.54 kg/m. Wt Readings from Last 3 Encounters:  03/19/22 185 lb (83.9 kg)  03/07/22 186 lb (84.4 kg)  02/28/22 184 lb (83.5 kg)  NEUROLOGIC: MENTAL STATUS:  awake, alert, oriented to person, place and time recent and remote memory intact normal attention and concentration  CRANIAL  NERVE:  2nd, 3rd, 4th, 6th - pupils equal and reactive to light, visual fields full to confrontation, extraocular muscles intact, no nystagmus 5th - decreased sensation left V1 7th - facial strength symmetric 8th - hearing intact 9th - palate elevates symmetrically, uvula midline 11th - shoulder shrug symmetric 12th - tongue protrusion midline  MOTOR:  normal bulk and tone, full strength in the BUE, BLE  SENSORY:  Decreased sensation to light touch RUE, otherwise sensation intact to light touch all 4 extremities  COORDINATION:  finger-nose-finger intact bilaterally  REFLEXES:  deep tendon reflexes present and symmetric  GAIT/STATION:  normal  DIAGNOSTIC DATA (LABS, IMAGING, TESTING) - I reviewed patient records, labs, notes, testing and imaging myself where available.  Lab Results  Component Value Date   WBC 3.7 (L) 08/21/2021   HGB 15.7 08/21/2021   HCT 44.9 08/21/2021   MCV 87.5 08/21/2021   PLT 109 (L) 08/21/2021      Component Value Date/Time   NA 139 08/21/2021 1315   NA 140 08/15/2012 1350   K 4.1 08/21/2021 1315   K 3.7 08/15/2012 1350   CL 103 08/21/2021 1315   CL 103 08/15/2012 1350   CO2 27 08/21/2021 1315   CO2 29 08/15/2012 1350   GLUCOSE 96 08/21/2021 1315   GLUCOSE 83 08/15/2012 1350   BUN 10 08/21/2021 1315   BUN 10.4 08/15/2012 1350   CREATININE 0.88 08/21/2021 1315   CREATININE 1.0 08/15/2012 1350   CALCIUM 9.2 08/21/2021 1315   CALCIUM 9.0 08/15/2012 1350   PROT 6.5 08/21/2021 1315   PROT 6.7 08/15/2012 1350   ALBUMIN 4.3 08/21/2021 1315   ALBUMIN 3.8 08/15/2012 1350   AST 21 08/21/2021 1315   AST 22 08/15/2012 1350   ALT 12 08/21/2021 1315   ALT 21 08/15/2012 1350   ALKPHOS 47 08/21/2021 1315   ALKPHOS 63 08/15/2012 1350   BILITOT 1.5 (H) 08/21/2021 1315   BILITOT 1.67 (H) 08/15/2012 1350   GFRNONAA >60 08/21/2021 1315   GFRNONAA 87 01/24/2012 1141   GFRAA >90 07/02/2012 1305   GFRAA >89 01/24/2012 1141   No results found  for: "CHOL", "HDL", "LDLCALC", "LDLDIRECT", "TRIG", "CHOLHDL" No results found for: "HGBA1C" Lab Results  Component Value Date   VITAMINB12 256 06/25/2008   Lab Results  Component Value Date   TSH 0.03 (L) 08/05/2008    ASSESSMENT AND PLAN  66 y.o. year old male with a history of traumatic SAH, Graves disease, afib, CHF, pulmonary hypertension, asthma, COPD who presents for evaluation of syncope and headaches following traumatic SAH. Suspect his syncope was vasovagal secondary to recent COVID infection and prolonged standing. He has not had any episodes of loss of consciousness since the event in August. Would consider EEG if syncope reoccurs. Will order brain MRI as he is having side-locked headaches and has decreased sensation on the left side of his face. MRI C-spine ordered to assess for post-traumatic changes given radicular pain and decreased sensation in his right upper extremity. Will start Topamax for headache prevention. Could consider Nurtec/Ubrelvy for rescue, but would wait until 6 months out from [facility] to initiate this (February 2024).   1. Acute post-traumatic headache, not intractable   2. Headache with neurologic deficit   3. Cervical radiculopathy       PLAN: -MRI brain, C-spine -Start Topamax 25 mg QHS, increase by 25 mg weekly up to 100 mg QHS -Next steps: Consider verapamil, TCA, SNRI for  headache prevention. Consider PRN gepant (would wait until 6 months after SAH)  Orders Placed This Encounter  Procedures   MR BRAIN W WO CONTRAST   MR CERVICAL SPINE WO CONTRAST    Meds ordered this encounter  Medications   topiramate (TOPAMAX) 25 MG tablet    Sig: Take 25 mg (1 pill) at bedtime for one week, then increase to 50 mg (2 pills) at bedtime for one week, then take 75 mg (3 pills) at bedtime for one week, then take 100 mg (4 pills) at bedtime    Dispense:  120 tablet    Refill:  6    Return in about 3  months (around 06/19/2022).    Genia Harold,  MD 03/19/22 3:28 PM  I spent an average of 44 minutes chart reviewing and counseling the patient, with at least 50% of the time face to face with the patient.   [facility] [address], [place], [place] [address] [phone]

## 2022-03-19 NOTE — Telephone Encounter (Signed)
CD of Head CT, Thorax CT and Echo from Aug. 2023 in [place] received and placed in Dr. Agustina Caroli review folder. Pt is requesting CD upon return.

## 2022-03-19 NOTE — Patient Instructions (Addendum)
Start Topamax for headache prevention. Take 25 mg (1 pill) at bedtime for one week, then increase to 50 mg (2 pills) at bedtime for one week, then take 75 mg (3 pills) at bedtime for one week, then take 100 mg (4 pills) at bedtime  MRI of the brain and cervical spine

## 2022-03-21 ENCOUNTER — Telehealth: Payer: Self-pay | Admitting: Psychiatry

## 2022-03-21 NOTE — Telephone Encounter (Signed)
Mcarthur Rossetti Josem Kaufmann: [phone] exp. 04/20/22 sent to GI

## 2022-03-22 DIAGNOSIS — I480 Paroxysmal atrial fibrillation: Secondary | ICD-10-CM | POA: Diagnosis not present

## 2022-03-22 DIAGNOSIS — R002 Palpitations: Secondary | ICD-10-CM | POA: Diagnosis not present

## 2022-03-27 ENCOUNTER — Telehealth: Payer: Self-pay | Admitting: Internal Medicine

## 2022-03-27 ENCOUNTER — Telehealth: Payer: Self-pay | Admitting: Emergency Medicine

## 2022-03-27 NOTE — Telephone Encounter (Signed)
The patient has been notified of the result and verbalized understanding.  All questions (if any) were answered. Bedelia Person Petrina Melby, RN 03/27/2022 5:00 PM   Advised pt to monitor BP for 2 weeks and send in readings through my chart.  Reports BP has been elevated.   Also would like MD to know neurology thinks dizziness is/ was r/t brain healing.  Reports has not had dizziness in 1.5-2 weeks.

## 2022-03-27 NOTE — Telephone Encounter (Signed)
Patient calling in bout his results from monitor and what the next step will be. Please advise

## 2022-03-27 NOTE — Telephone Encounter (Signed)
-----   Message from Werner Lean, MD sent at 03/25/2022  3:44 PM EDT ----- Results: Rare irregular heart beats called SVT- Coreg 3.125 mg PO BID  Werner Lean, MD

## 2022-03-28 MED ORDER — SYMBICORT 160-4.5 MCG/ACT IN AERO: 2.0000 | INHALATION_SPRAY | Freq: Two times a day (BID) | RESPIRATORY_TRACT | 3 refills | Status: DC

## 2022-03-28 NOTE — Telephone Encounter (Signed)
Called patient and verified that he was needing refills on Symbicort and what pharmacy he wanted them to go to.   While on the phone he asked about the disk that he dropped off. He states he is wanting Dr Lamonte Sakai to compare the images. And that he would like the disk back.  Estill Bamberg do you know if we got the disk for this patient. He states he dropped it off at the office.  Please advise and thank you

## 2022-03-29 NOTE — Telephone Encounter (Signed)
Please let the patient know that I have reviewed his Ct chest from [place], [place] done 01/29/22. Small nodules seen in the RLL and LLL (2-54m) that do not need to be followed. This is good news.

## 2022-03-29 NOTE — Telephone Encounter (Signed)
Disk is in Dr. Agustina Caroli B-Pod mail box. Will ask Dr. Lamonte Sakai to return disk after it is reviewed. Will contact pt once disk is reviewed.

## 2022-03-30 ENCOUNTER — Other Ambulatory Visit: Payer: Self-pay | Admitting: *Deleted

## 2022-03-30 MED ORDER — SYMBICORT 160-4.5 MCG/ACT IN AERO: 2.0000 | INHALATION_SPRAY | Freq: Two times a day (BID) | RESPIRATORY_TRACT | 3 refills | Status: DC

## 2022-03-30 NOTE — Telephone Encounter (Signed)
Called and spoke with patient. He verbalized understanding. ? ?Nothing further needed at time of call.  ?

## 2022-04-01 ENCOUNTER — Ambulatory Visit
Admission: RE | Admit: 2022-04-01 | Discharge: 2022-04-01 | Disposition: A | Discharge status: Home / Self Care | Payer: Medicare Other | Source: Ambulatory Visit | Attending: Psychiatry | Admitting: Psychiatry

## 2022-04-01 DIAGNOSIS — M5412 Radiculopathy, cervical region: Secondary | ICD-10-CM | POA: Diagnosis not present

## 2022-04-01 DIAGNOSIS — R519 Headache, unspecified: Secondary | ICD-10-CM

## 2022-04-01 DIAGNOSIS — R29818 Other symptoms and signs involving the nervous system: Secondary | ICD-10-CM | POA: Diagnosis not present

## 2022-04-01 MED ORDER — GADOPICLENOL 0.5 MMOL/ML IV SOLN
8.0000 mL | Freq: Once | INTRAVENOUS | Status: AC | PRN
  Administered 2022-04-01: 8 mL via INTRAVENOUS

## 2022-04-03 ENCOUNTER — Other Ambulatory Visit: Payer: Self-pay | Admitting: Psychiatry

## 2022-04-03 DIAGNOSIS — M542 Cervicalgia: Secondary | ICD-10-CM

## 2022-04-09 ENCOUNTER — Encounter: Payer: Self-pay | Admitting: Internal Medicine

## 2022-04-09 MED ORDER — CARVEDILOL 3.125 MG PO TABS: 3.1250 mg | ORAL_TABLET | Freq: Two times a day (BID) | ORAL | 3 refills | Status: DC

## 2022-04-11 ENCOUNTER — Ambulatory Visit: Payer: Medicare HMO | Attending: Psychiatry

## 2022-04-11 DIAGNOSIS — M542 Cervicalgia: Secondary | ICD-10-CM | POA: Diagnosis not present

## 2022-04-11 DIAGNOSIS — R2681 Unsteadiness on feet: Secondary | ICD-10-CM | POA: Insufficient documentation

## 2022-04-11 DIAGNOSIS — R42 Dizziness and giddiness: Secondary | ICD-10-CM | POA: Diagnosis not present

## 2022-04-11 NOTE — Therapy (Signed)
OUTPATIENT PHYSICAL THERAPY CERVICAL EVALUATION   Patient Name: [name] MRN: [id number] DOB:Jan 21, 1956, 66 y.o., male Today's Date: 04/11/2022   PT End of Session - 04/11/22 0841     Visit Number 1    Number of Visits 1    Authorization Type [employer]    PT Start Time 0845    PT Stop Time 0915    PT Time Calculation (min) 30 min    Activity Tolerance Patient tolerated treatment well    Behavior During Therapy [facility] for tasks assessed/performed             Past Medical History:  Diagnosis Date   CHF (congestive heart failure) ([place])    Chronic hepatitis C (HCC)    COPD (chronic obstructive pulmonary disease) (HCC)    Dizziness    Hepatitis C    Hyperthyroidism    Syncope and collapse    Thrombocytopenia (HCC)    Past Surgical History:  Procedure Laterality Date   COLONOSCOPY  10/27/2015   POLYPECTOMY     Patient Active Problem List   Diagnosis Date Noted   Pulmonary nodules 03/07/2022   Acute bronchitis with COPD ([place]) 07/25/2017   DOE (dyspnea on exertion) 07/02/2012   Acute sinusitis 04/22/2012   Allergic rhinitis 01/24/2012   CHEST PAIN 03/03/2010   HOARSENESS 09/26/2009   HYPERTENSION, BENIGN 04/05/2009   PANCYTOPENIA 01/05/2009   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   ATRIAL FIBRILLATION 35/70/1779   DIASTOLIC HEART FAILURE, CHRONIC 09/10/2008   DIARRHEA 09/10/2008   COPD with asthma ([place]) 08/04/2007    PCP: Reynold Bowen, MD  REFERRING PROVIDER: Genia Harold, MD   REFERRING DIAG: M54.2 (ICD-10-CM) - Cervicalgia   THERAPY DIAG:  Unsteadiness on feet  Dizziness and giddiness  Rationale for Evaluation and Treatment Rehabilitation  ONSET DATE: 01/29/22 injury date  SUBJECTIVE:                                                                                                                                                                                                         SUBJECTIVE STATEMENT: Patient reports passing out, unsure what  caused this, and hit the back of his head. He's "gone through phases" with his "brain healing." Had a phase of bad headaches- no longer has these. Denies any significant dizziness now, but did have dizziness with laying down and moving head suddenly.   PERTINENT HISTORY:  Traumatic SAH, Graves disease, afib, CHF, pulmonary hypertension, asthma, COPD   PAIN:  Are you having pain? No  PRECAUTIONS: Fall  WEIGHT BEARING RESTRICTIONS: No  FALLS:  Has patient  fallen in last 6 months? Yes. 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  None of the foci appear to be acute and they do not enhance Single chronic microhemorrhage in the left periventricular white matter.  A single focus is unlikely to be clinically significant.  COGNITION: Overall cognitive status: Within functional limits for tasks assessed  SENSATION: WFL  POSTURE: No Significant postural limitations   CERVICAL ROM:   Active ROM A/PROM (deg) eval  Flexion WFL  Extension WFL  Right lateral flexion WFL  Left lateral flexion WFL  Right rotation WFL  Left rotation WFL   (Blank rows = not tested)  UPPER EXTREMITY ROM:  Active ROM Right eval Left eval  Shoulder flexion    Shoulder extension    Shoulder abduction    Shoulder adduction    Shoulder extension     Shoulder internal rotation    Shoulder external rotation    Elbow flexion    Elbow extension    Wrist flexion    Wrist extension    Wrist ulnar deviation    Wrist radial deviation    Wrist pronation    Wrist supination     (Blank rows = not tested)  UPPER EXTREMITY MMT:  All WFL and pain free  CERVICAL SPECIAL TESTS:  Spurling's test: Negative, Distraction test: Negative, and Sharp pursor's test: Negative   Vestibular Asssessment   General Observation: NAD    Symptom Behavior:   Subjective history: see above   Non-Vestibular symptoms:  none   Type of dizziness:  at one point experienced room-spinning dizziness, but that was weeks ago   Progression of symptoms: better   Oculomotor Exam:   Ocular Alignment: normal   Ocular ROM: No Limitations   Spontaneous Nystagmus: absent   Gaze-Induced Nystagmus: absent   Smooth Pursuits: intact   Saccades: intact   Convergence/Divergence: <6 cm     Vestibular-Ocular Reflex (VOR):   Slow VOR: Normal   VOR Cancellation: Normal   Head-Impulse Test: HIT Right: negative HIT Left: negative    Motion Sensitivity:   Motion Sensitivity Quotient  Intensity: 0 = none, 1 = Lightheaded, 2 = Mild, 3 = Moderate, 4 = Severe, 5 = Vomiting Duration: < 5 s = 0, 5-10s = 1,11-30s = 2, >30s = 3 Score = Intensity + duration     Intensity Duration Score  1. Sitting to supine 0    2. Supine to L side 0    3. Supine to R side 0    4. Supine to sitting 0    5. L Hallpike-Dix 0    6. Up from L  0    7. R Hallpike-Dix 0    8. Up from R  0    9. Sitting, head  tipped to L knee 0    10. Head up from L  knee 0    11. Sitting,  head  tipped to R knee 0    12. Head up from R  knee 0    13. Sitting head turns x5 0    14.Sitting head nods x5 0    15. In stance, 180  turn to L  0    16. In stance, 180  turn to R 0     MSQ = Total score  (# of positions) / 20.48 MSQ = __________________  0-10 mild; 11-30 moderate; 31-100  severe      Balance/Gait: MCTSIB: Condition 1: 30s; Condition 2: 30s; Condition 3: 30s; Condition 4: 30s    10 Meter Gait Velocity Test: 1.47ms     TODAY'S TREATMENT:                                                                                                                      DATE:  N/A eval  PATIENT EDUCATION:  Education details: PT POC, exam findings, post-concussion timeline healing, return to exercise Person educated: Patient Education method: Explanation Education comprehension: verbalized understanding  ASSESSMENT:  CLINICAL IMPRESSION: Patient is a 66y.o. male who was seen today for physical therapy evaluation and treatment for post-concussion HA and dizziness. Since last visit to MD, patient has had resolution of essentially all symptoms including HA, neck pain, dizziness and radicular symptoms. Normal VOR, HIT and M-CTSIB. 10 Meter Walk Test: Patient instructed to walk 10 meters (32.8 ft) as quickly and as safely as possible at their normal speed x2 and at a fast speed x2. Time measured from 2 meter mark to 8 meter mark to accommodate ramp-up and ramp-down.  Normal speed: 1.474m Cut off scores: <0.4 m/s = household Ambulator, 0.4-0.8 m/s = limited community Ambulator, >0.8 m/s = community Ambulator, >1.2 m/s = crossing a street, <1.0 = increased fall risk MCID 0.05 m/s (small), 0.13 m/s (moderate), 0.06 m/s (significant)  (ANPTA Core Set of Outcome Measures for Adults with Neurologic Conditions, 2018). (-) cervical testing as well. Patient does not require skilled PT services at this time. May return with new referral if symptoms return.    CLINICAL DECISION MAKING: Stable/uncomplicated  EVALUATION COMPLEXITY: Low   GOALS: Goals reviewed with patient? Yes Goals not set at this time as patient does not require skilled PT services  PLAN:  PT FREQUENCY: one time visit  PT DURATION: other: 1 time visit    JeDebbora DusPT JeDebbora DusPT, DPT,  CBIS  04/11/2022, 9:20 AM

## 2022-05-02 ENCOUNTER — Telehealth: Payer: Self-pay | Admitting: Internal Medicine

## 2022-05-02 MED ORDER — DILTIAZEM HCL ER 120 MG PO TB24: 120.0000 mg | ORAL_TABLET | Freq: Every day | ORAL | 11 refills | Status: DC

## 2022-05-02 NOTE — Telephone Encounter (Signed)
Please see my chart encounter for details. 

## 2022-05-02 NOTE — Telephone Encounter (Signed)
Left a message to call back.

## 2022-05-02 NOTE — Addendum Note (Signed)
Addended by: Precious Gilding on: 05/02/2022 02:57 PM   Modules accepted: Orders

## 2022-05-02 NOTE — Telephone Encounter (Signed)
Spoke with pt advised of MD recommendation: "We can try diltiazem 120 mg PO XL daily. This will help with PVCs Check AMB BP We may need an additional med if needed. "  Pt expresses understanding.  Carvedilol Dc'd.  Diltiazem 120 mg PO QD sent to pharmacy on file #[phone] refills per pt request.

## 2022-05-02 NOTE — Telephone Encounter (Signed)
Patient states he is returning a call received today from [facility].

## 2022-05-23 DIAGNOSIS — D696 Thrombocytopenia, unspecified: Secondary | ICD-10-CM | POA: Diagnosis not present

## 2022-05-23 DIAGNOSIS — R55 Syncope and collapse: Secondary | ICD-10-CM | POA: Diagnosis not present

## 2022-05-23 DIAGNOSIS — L819 Disorder of pigmentation, unspecified: Secondary | ICD-10-CM | POA: Diagnosis not present

## 2022-05-23 DIAGNOSIS — D510 Vitamin B12 deficiency anemia due to intrinsic factor deficiency: Secondary | ICD-10-CM | POA: Diagnosis not present

## 2022-05-23 DIAGNOSIS — R778 Other specified abnormalities of plasma proteins: Secondary | ICD-10-CM | POA: Diagnosis not present

## 2022-05-23 DIAGNOSIS — I10 Essential (primary) hypertension: Secondary | ICD-10-CM | POA: Diagnosis not present

## 2022-05-23 DIAGNOSIS — R911 Solitary pulmonary nodule: Secondary | ICD-10-CM | POA: Diagnosis not present

## 2022-05-23 DIAGNOSIS — J449 Chronic obstructive pulmonary disease, unspecified: Secondary | ICD-10-CM | POA: Diagnosis not present

## 2022-05-23 DIAGNOSIS — I480 Paroxysmal atrial fibrillation: Secondary | ICD-10-CM | POA: Diagnosis not present

## 2022-06-15 DIAGNOSIS — R21 Rash and other nonspecific skin eruption: Secondary | ICD-10-CM | POA: Diagnosis not present

## 2022-06-15 DIAGNOSIS — L309 Dermatitis, unspecified: Secondary | ICD-10-CM | POA: Diagnosis not present

## 2022-06-19 ENCOUNTER — Ambulatory Visit: Payer: Medicare PPO | Admitting: Neurology

## 2022-06-19 VITALS — BP 130/79 | HR 57 | Ht 70.0 in | Wt 184.0 lb

## 2022-06-19 DIAGNOSIS — G44309 Post-traumatic headache, unspecified, not intractable: Secondary | ICD-10-CM | POA: Insufficient documentation

## 2022-06-19 DIAGNOSIS — G44319 Acute post-traumatic headache, not intractable: Secondary | ICD-10-CM | POA: Diagnosis not present

## 2022-06-19 NOTE — Progress Notes (Signed)
Patient: [name] Date of Birth: [birth year]  Reason for Visit: Follow up History from: Patient Primary Neurologist: [place]   ASSESSMENT AND PLAN 67 y.o. year old male   74.  Acute posttraumatic headache -Headache and neck pain have resolved -Recommended good management of vascular risk factors and exercise given advanced chronic microvascular ischemic change by MRI of the brain -Follow-up on an as-needed basis  HISTORY OF PRESENT ILLNESS: Today 06/19/22 Here today alone. Went to PT, was told he didn't need therapy. Currently denies any neck pain. Headaches have resolved. He never started the Topamax. He feels just a healing process, gave it more time. He is doing very well. He has no concerns. Health has been good. He just drove back from [place] for a quick trip. Lives with his wife.   MRI of the brain with and without contrast 04/01/22 showed extensive white matter changes consistent with advanced chronic microvascular ischemic change more than typical for age.  MRI cervical spine showed mild spinal stenosis and moderate left foraminal narrowing at C4-C5 other various mild degenerative changes.  03/19/22 Dr. Billey Gosling HISTORY OF PRESENT ILLNESS:  The patient presents for evaluation of syncope and headaches.   On 01/29/22 when he passed out and collapsed at a funeral. Eyes rolled to the back of his head and he fell backwards. No tongue biting, shaking, or loss of continence. He was slightly confused for a few minutes afterwards. Notes that he had COVID the week before this and felt very low energy. He presented to the ED where [facility] showed a traumatic right occipital SAH. Repeat CTH in the hospital showed resolving SAH.    Since the hospitalization he has had daily severe headaches. They usually wake him up in the middle of the night. Headaches are described as left-sided throbbing behind his eye, behind his ear, and around his neck. His scalp is sensitive on the left side. Headaches are  associated with photophobia, no nausea. Left eye will get blurry and tear up. He will feel restless and has to get up. They last for about an hour, then resolve, then will often reoccur later that day. He has 2-3 headaches per day. He has tried [place] powder, Tylenol, and Motrin without improvement. Tried a muscle relaxer one night, but did not find this helpful. He also has spinning and a "swimming" sensation when he lies his head back on the pillow.   Since the fall he has had neck pain which shoots down bilateral shoulders and upper arms. Has also noticed decreased sensation of his right upper extremity. Denies upper extremity weakness.   [facility] 03/13/22 showed chronic microvascular disease and was otherwise unremarkable   Saw Cardiology who believed his syncope was vasovagal in nature. He was placed on a Zio patch to assess for arrhythmia given history of afib.  REVIEW OF SYSTEMS: Out of a complete 14 system review of symptoms, the patient complains only of the following symptoms, and all other reviewed systems are negative.  See HPI  ALLERGIES: Allergies  Allergen Reactions   Atenolol     Other reaction(s): Other   Methimazole     Other reaction(s): Unknown   Other Other (See Comments)    "Med for hypothyroidism. Killed white blood cells."--?Atenol   Theophyllines     HOME MEDICATIONS: Outpatient Medications Prior to Visit  Medication Sig Dispense Refill   albuterol (VENTOLIN HFA) 108 (90 Base) MCG/ACT inhaler Inhale 2 puffs into the lungs every 6 (six) hours as needed for wheezing or  shortness of breath. 8 g 2   Cholecalciferol 50 MCG (2000 UT) TABS daily.     Cyanocobalamin (VITAMIN B-12) 5000 MCG SUBL daily.     diltiazem (CARDIZEM LA) 120 MG 24 hr tablet Take 1 tablet (120 mg total) by mouth daily. 30 tablet 11   levothyroxine (SYNTHROID, LEVOTHROID) 100 MCG tablet Take 100 mcg by mouth. 6 days 1 tab/1 1/2 tab on the 7th day     SYMBICORT 160-4.5 MCG/ACT inhaler Inhale 2 puffs  into the lungs in the morning and at bedtime. USE 2 INHALATIONS BY MOUTH  INTO THE LUNGS TWICE DAILY 30.6 g 3   Zinc Gluconate 100 MG TABS Take by mouth daily.     topiramate (TOPAMAX) 25 MG tablet Take 25 mg (1 pill) at bedtime for one week, then increase to 50 mg (2 pills) at bedtime for one week, then take 75 mg (3 pills) at bedtime for one week, then take 100 mg (4 pills) at bedtime 120 tablet 6   No facility-administered medications prior to visit.    PAST MEDICAL HISTORY: Past Medical History:  Diagnosis Date   CHF (congestive heart failure) (HCC)    Chronic hepatitis C (HCC)    COPD (chronic obstructive pulmonary disease) (HCC)    Dizziness    Hepatitis C    Hyperthyroidism    Syncope and collapse    Thrombocytopenia (HCC)     PAST SURGICAL HISTORY: Past Surgical History:  Procedure Laterality Date   COLONOSCOPY  10/27/2015   POLYPECTOMY      FAMILY HISTORY: Family History  Problem Relation Age of Onset   Diabetes Mother    Colon cancer Neg Hx    Colon polyps Neg Hx    Esophageal cancer Neg Hx    Rectal cancer Neg Hx    Stomach cancer Neg Hx     SOCIAL HISTORY: Social History   Socioeconomic History   Marital status: Married    Spouse name: Not on file   Number of children: 6   Years of education: Not on file   Highest education level: Not on file  Occupational History   Not on file  Tobacco Use   Smoking status: Former    Packs/day: 1.00    Years: 20.00    Total pack years: 20.00    Types: Cigarettes    Quit date: 06/18/1986    Years since quitting: 36.0   Smokeless tobacco: Never  Vaping Use   Vaping Use: Never used  Substance and Sexual Activity   Alcohol use: No   Drug use: No   Sexual activity: Not on file  Other Topics Concern   Not on file  Social History Narrative   Lives with wife   Caffeine 2 a day   Social Determinants of Health   Financial Resource Strain: Not on file  Food Insecurity: Not on file  Transportation Needs: Not on  file  Physical Activity: Not on file  Stress: Not on file  Social Connections: Not on file  Intimate Partner Violence: Not on file    PHYSICAL EXAM  Vitals:   06/19/22 1337  BP: 130/79  Pulse: (!) 57  Weight: 184 lb (83.5 kg)  Height: '5\' 10"'$  (1.778 m)   Body mass index is 26.4 kg/m.  Generalized: Well developed, in no acute distress  Neurological examination  Mentation: Alert oriented to time, place, history taking. Follows all commands speech and language fluent Cranial nerve II-XII: Pupils were equal round reactive to light. Extraocular  movements were full, visual field were full on confrontational test. Facial sensation and strength were normal. Head turning and shoulder shrug  were normal and symmetric. Motor: The motor testing reveals 5 over 5 strength of all 4 extremities. Good symmetric motor tone is noted throughout.  Sensory: Sensory testing is intact to soft touch on all 4 extremities. No evidence of extinction is noted.  Coordination: Cerebellar testing reveals good finger-nose-finger and heel-to-shin bilaterally.  Gait and station: Gait is normal.  Reflexes: Deep tendon reflexes are symmetric and normal bilaterally.   DIAGNOSTIC DATA (LABS, IMAGING, TESTING) - I reviewed patient records, labs, notes, testing and imaging myself where available.  Lab Results  Component Value Date   WBC 3.7 (L) 08/21/2021   HGB 15.7 08/21/2021   HCT 44.9 08/21/2021   MCV 87.5 08/21/2021   PLT 109 (L) 08/21/2021      Component Value Date/Time   NA 139 08/21/2021 1315   NA 140 08/15/2012 1350   K 4.1 08/21/2021 1315   K 3.7 08/15/2012 1350   CL 103 08/21/2021 1315   CL 103 08/15/2012 1350   CO2 27 08/21/2021 1315   CO2 29 08/15/2012 1350   GLUCOSE 96 08/21/2021 1315   GLUCOSE 83 08/15/2012 1350   BUN 10 08/21/2021 1315   BUN 10.4 08/15/2012 1350   CREATININE 0.88 08/21/2021 1315   CREATININE 1.0 08/15/2012 1350   CALCIUM 9.2 08/21/2021 1315   CALCIUM 9.0 08/15/2012 1350    PROT 6.5 08/21/2021 1315   PROT 6.7 08/15/2012 1350   ALBUMIN 4.3 08/21/2021 1315   ALBUMIN 3.8 08/15/2012 1350   AST 21 08/21/2021 1315   AST 22 08/15/2012 1350   ALT 12 08/21/2021 1315   ALT 21 08/15/2012 1350   ALKPHOS 47 08/21/2021 1315   ALKPHOS 63 08/15/2012 1350   BILITOT 1.5 (H) 08/21/2021 1315   BILITOT 1.67 (H) 08/15/2012 1350   GFRNONAA >60 08/21/2021 1315   GFRNONAA 87 01/24/2012 1141   GFRAA >90 07/02/2012 1305   GFRAA >89 01/24/2012 1141   No results found for: "CHOL", "HDL", "LDLCALC", "LDLDIRECT", "TRIG", "CHOLHDL" No results found for: "HGBA1C" Lab Results  Component Value Date   VITAMINB12 256 06/25/2008   Lab Results  Component Value Date   TSH 0.03 (L) 08/05/2008    Butler Denmark, AGNP-C, DNP 06/19/2022, 2:05 PM [facility] [address], [place], [place] [address] [phone]

## 2022-06-29 ENCOUNTER — Telehealth: Payer: Self-pay | Admitting: Internal Medicine

## 2022-06-29 DIAGNOSIS — R072 Precordial pain: Secondary | ICD-10-CM

## 2022-06-29 NOTE — Telephone Encounter (Signed)
Pt c/o of Chest Pain: STAT if CP now or developed within 24 hours  1. Are you having CP right now? Yes, when alarm went off   2. Are you experiencing any other symptoms (ex. SOB, nausea, vomiting, sweating)? Numbness in both arms   3. How long have you been experiencing CP? A week   4. Is your CP continuous or coming and going? Coming and going   5. Have you taken Nitroglycerin? No    I called the patient this morning to reschedule 01/22 appt with Dr. Gasper Sells due to his schedule change. While speaking with the patient about rescheduling he notified me he has been having symptoms of arm numbness and CP for a week. He reports it started as numbness in his left arm when he wakes up throughout the night or first thing in the morning, and is now occurring in both arms. He states it is to the existent he has to wait for sensation to come back before he is able to grab his water on his nightstand when he wakes up throughout the night. Patient also reports when his alarm goes off in the morning that sounds likes bells the shock of the sound gives him CP. He states these symptoms do not occur any other time and denies currently having any symptoms. Patient is going into a class that will last an hour so he is requesting a callback after 10:00 am so he is able to answer. Please advise.

## 2022-06-29 NOTE — Telephone Encounter (Signed)
Called patient back. He denies any chest pain. Patient stated his arms are numb when he wakes up at 4:30 in the morning every day. Patient stated it was just one arm, now it's both. Clarified with patient that he was not having any chest pain and he stated no. Patient has feeling back in his arms, but he is concerned because this keep happening. Informed patient of the signs and symptoms of a stroke and when to call 911. Encourage patient to call his PCP about his arm numbness, and to keep his appointment with Dr. Julieanne Manson in a few weeks. Will send message to Dr. Julieanne Manson for advisement.

## 2022-07-03 NOTE — Telephone Encounter (Signed)
Spoke with pt reviewed MD recommendations for Cardiac CT.  Reviewed instructions pt reports HR ranges mid 50's to mid 60's.  Pt had an OV with neurology on 06/19/22 HR 57.   Pt will come in for labs on 07/04/22.    While inputting metoprolol 25 mg PO to be taken 2 hours prior to cardiac ct. Received flag pt has a high allergy to atenolol.  Called pt to assess reaction pt expresses med destroyed WBC.  Will send to provider to see if another med should be ordered.

## 2022-07-03 NOTE — Telephone Encounter (Signed)
Left a message advising pt MD would like pt to have a Cardiac CT.  Request pt call me back at the office to review instructions.  Cardiac CT instruction letter pending.

## 2022-07-05 ENCOUNTER — Ambulatory Visit: Payer: Medicare PPO | Attending: Internal Medicine

## 2022-07-05 DIAGNOSIS — R072 Precordial pain: Secondary | ICD-10-CM

## 2022-07-05 MED ORDER — DILTIAZEM HCL 30 MG PO TABS: ORAL_TABLET | ORAL | 0 refills | Status: DC

## 2022-07-05 NOTE — Telephone Encounter (Signed)
Called pt advised MD would request Diltiazem 30 mg 2 hours prior to Cardiac CT.  Pt expresses already takes diltiazem 120 mg PO QD.  Advised will need to take the additional 30 mg to ensure HR is low enough to get good pictures of the arteries of the heart.  Pt will come in for labs today 07/05/22.  Pt voiced no further concerns.  Instructions to be released via my chart for pt to review.

## 2022-07-06 LAB — BASIC METABOLIC PANEL
BUN/Creatinine Ratio: 9 — ABNORMAL LOW (ref 10–24)
BUN: 8 mg/dL (ref 8–27)
CO2: 28 mmol/L (ref 20–29)
Calcium: 9.3 mg/dL (ref 8.6–10.2)
Chloride: 101 mmol/L (ref 96–106)
Creatinine, Ser: 0.93 mg/dL (ref 0.76–1.27)
Glucose: 86 mg/dL (ref 70–99)
Potassium: 4.5 mmol/L (ref 3.5–5.2)
Sodium: 142 mmol/L (ref 134–144)
eGFR: 90 mL/min/{1.73_m2} (ref >59)

## 2022-07-09 ENCOUNTER — Ambulatory Visit: Payer: Medicare HMO | Admitting: Internal Medicine

## 2022-07-12 ENCOUNTER — Telehealth (HOSPITAL_COMMUNITY): Payer: Self-pay | Admitting: *Deleted

## 2022-07-12 ENCOUNTER — Encounter (HOSPITAL_COMMUNITY): Payer: Self-pay

## 2022-07-12 DIAGNOSIS — M7022 Olecranon bursitis, left elbow: Secondary | ICD-10-CM | POA: Diagnosis not present

## 2022-07-12 DIAGNOSIS — M25522 Pain in left elbow: Secondary | ICD-10-CM | POA: Diagnosis not present

## 2022-07-12 DIAGNOSIS — R2232 Localized swelling, mass and lump, left upper limb: Secondary | ICD-10-CM | POA: Diagnosis not present

## 2022-07-12 NOTE — Telephone Encounter (Signed)
Reaching out to patient to offer assistance regarding upcoming cardiac imaging study; pt verbalizes understanding of appt date/time, parking situation and where to check in, pre-test NPO status and medications ordered, and verified current allergies; name and call back number provided for further questions should they arise  Gordy Clement RN Navigator Card[facility] [phone] office [phone] cell  Patient states HR is in the high 50's. He will only take the additional cardizem if his HR is greater than 65bpm two hours prior to his cardiac CT scan. He is aware to arrive at 12pm.

## 2022-07-13 ENCOUNTER — Ambulatory Visit (HOSPITAL_COMMUNITY)
Admission: RE | Admit: 2022-07-13 | Discharge: 2022-07-13 | Disposition: A | Discharge status: Home / Self Care | Payer: Medicare PPO | Source: Ambulatory Visit | Attending: Internal Medicine | Admitting: Internal Medicine

## 2022-07-13 DIAGNOSIS — R072 Precordial pain: Secondary | ICD-10-CM | POA: Insufficient documentation

## 2022-07-13 MED ORDER — IOHEXOL 350 MG/ML SOLN
100.0000 mL | Freq: Once | INTRAVENOUS | Status: AC | PRN
  Administered 2022-07-13: 100 mL via INTRAVENOUS

## 2022-07-13 MED ORDER — NITROGLYCERIN 0.4 MG SL SUBL
SUBLINGUAL_TABLET | SUBLINGUAL | Status: AC
  Filled 2022-07-13: qty 2

## 2022-07-13 MED ORDER — NITROGLYCERIN 0.4 MG SL SUBL
0.8000 mg | SUBLINGUAL_TABLET | Freq: Once | SUBLINGUAL | Status: AC
  Administered 2022-07-13: 0.8 mg via SUBLINGUAL

## 2022-07-15 NOTE — Progress Notes (Unsigned)
Cardiology Office Note:    Date:  07/16/2022   ID:  [name], DOB 1955-09-26, MRN [id number]  PCP:  [name], [place] Prov[facility] Cardiologist:  Norman Lean, MD     Referring MD: Norman Bowen, MD   CC: CP follow up  History of Present Illness:    [name] is a 67 y.o. male with a hx of life threatening hyperthyroidism HFpEF, AF without ablation 2010, and COPD who presents after syncopal episode.  Has been some what lost to follow up since 2020 tele-visit from the pandemic. 2023: hx of traumatic SAH. 2024: Concern for chest pain- called in.  Had CCTA.  Minimal non obstructive CAC- no evidence of cardiac discomfort for symptoms.  Patient notes that he is doing better.   He had chest soreness in his arms and chest.  He had CCTA with minimal CAC There are no interval hospital/ED visit.    No further chest pain or pressure .  No SOB/DOE and no PND/Orthopnea.  No weight gain or leg swelling.  Since the CCTA he has had two episodes of dizziness with standing.  Also has described vertigo.    Past Medical History:  Diagnosis Date   CHF (congestive heart failure) (HCC)    Chronic hepatitis C (HCC)    COPD (chronic obstructive pulmonary disease) (HCC)    Dizziness    Hepatitis C    Hyperthyroidism    Syncope and collapse    Thrombocytopenia (HCC)     Past Surgical History:  Procedure Laterality Date   COLONOSCOPY  10/27/2015   POLYPECTOMY      Current Medications: Current Meds  Medication Sig   albuterol (VENTOLIN HFA) 108 (90 Base) MCG/ACT inhaler Inhale 2 puffs into the lungs every 6 (six) hours as needed for wheezing or shortness of breath.   Cholecalciferol 50 MCG (2000 UT) TABS daily.   Cyanocobalamin (VITAMIN B-12) 5000 MCG SUBL daily.   diltiazem (CARDIZEM LA) 120 MG 24 hr tablet Take 1 tablet (120 mg total) by mouth daily.   levothyroxine (SYNTHROID, LEVOTHROID) 100 MCG tablet Take 100 mcg by mouth. 6 days 1 tab/1 1/2  tab on the 7th day   SYMBICORT 160-4.5 MCG/ACT inhaler Inhale 2 puffs into the lungs in the morning and at bedtime. USE 2 INHALATIONS BY MOUTH  INTO THE LUNGS TWICE DAILY   Zinc Gluconate 100 MG TABS Take by mouth daily.     Allergies:   Atenolol, Methimazole, Other, and Theophyllines   Social History   Socioeconomic History   Marital status: Married    Spouse name: Not on file   Number of children: 6   Years of education: Not on file   Highest education level: Not on file  Occupational History   Not on file  Tobacco Use   Smoking status: Former    Packs/day: 1.00    Years: 20.00    Total pack years: 20.00    Types: Cigarettes    Quit date: 06/18/1986    Years since quitting: 36.1   Smokeless tobacco: Never  Vaping Use   Vaping Use: Never used  Substance and Sexual Activity   Alcohol use: No   Drug use: No   Sexual activity: Not on file  Other Topics Concern   Not on file  Social History Narrative   Lives with wife   Caffeine 2 a day   Social Determinants of Health   Financial Resource Strain: Not on file  Food Insecurity:  Not on file  Transportation Needs: Not on file  Physical Activity: Not on file  Stress: Not on file  Social Connections: Not on file    Family History: The patient's family history includes Diabetes in his mother. There is no history of Colon cancer, Colon polyps, Esophageal cancer, Rectal cancer, or Stomach cancer.  ROS:   Please see the history of present illness.     All other systems reviewed and are negative.  EKGs/Labs/Other Studies Reviewed:    The following studies were reviewed today:  EKG:   02/28/22: SR with septal infract pattern    Cardiac Studies & Procedures     STRESS TESTS  MYOCARDIAL PERFUSION IMAGING 10/27/2020  Narrative  The left ventricular ejection fraction is mildly decreased (45-54%).  Nuclear stress EF: 53%.  There was no ST segment deviation noted during stress.  There is a small defect of moderate  severity present in the basal inferior and apex location. The defect is non-reversible. Given normal wall motion this is consistent with attenuation artifact and increased extra cardiac activity. No ischemia noted.  This is a low risk study.   ECHOCARDIOGRAM  ECHOCARDIOGRAM COMPLETE 03/09/2022  Narrative ECHOCARDIOGRAM REPORT    Patient Name:   [name] Date of Exam: 03/09/2022 Medical Rec #:  [id number]      Height:       70.0 in Accession #:    [id number]     Weight:       186.0 lb Date of Birth:  1955/12/27     BSA:          2.024 m Patient Age:    76 years       BP:           124/70 mmHg Patient Gender: M              HR:           61 bpm. Exam Location:  [place]  Procedure: 2D Echo, 3D Echo, Cardiac Doppler, Color Doppler and Strain Analysis  Indications:    R55 Syncope  History:        Patient has prior history of Echocardiogram examinations, most recent 07/08/2012. CHF, COPD; Signs/Symptoms:Syncope. Hyperthyroidism.  Sonographer:    Cresenciano Lick RDCS Referring Phys: [id number] [facility] A Aubreanna Percle  IMPRESSIONS   1. Left ventricular ejection fraction, by estimation, is 55 to 60%. The left ventricle has normal function. The left ventricle has no regional wall motion abnormalities. There is moderate left ventricular hypertrophy. Left ventricular diastolic parameters were normal. The average left ventricular global longitudinal strain is -22.8 %. The global longitudinal strain is normal. 2. Right ventricular systolic function is normal. The right ventricular size is normal. 3. The mitral valve is normal in structure. Trivial mitral valve regurgitation. No evidence of mitral stenosis. 4. The aortic valve is tricuspid. Aortic valve regurgitation is not visualized. No aortic stenosis is present. 5. The inferior vena cava is dilated in size with >50% respiratory variability, suggesting right atrial pressure of 8 mmHg.  Comparison(s): No significant change  from prior study.  Conclusion(s)/Recommendation(s): Otherwise normal echocardiogram, with minor abnormalities described in the report.  FINDINGS Left Ventricle: Left ventricular ejection fraction, by estimation, is 55 to 60%. The left ventricle has normal function. The left ventricle has no regional wall motion abnormalities. The average left ventricular global longitudinal strain is -22.8 %. The global longitudinal strain is normal. 3D left ventricular ejection fraction analysis performed but not reported based on interpreter judgement due to  suboptimal tracking. The left ventricular internal cavity size was normal in size. There is moderate left ventricular hypertrophy. Left ventricular diastolic parameters were normal.  Right Ventricle: The right ventricular size is normal. No increase in right ventricular wall thickness. Right ventricular systolic function is normal.  Left Atrium: Left atrial size was normal in size.  Right Atrium: Right atrial size was normal in size.  Pericardium: There is no evidence of pericardial effusion.  Mitral Valve: The mitral valve is normal in structure. Trivial mitral valve regurgitation. No evidence of mitral valve stenosis.  Tricuspid Valve: The tricuspid valve is normal in structure. Tricuspid valve regurgitation is trivial. No evidence of tricuspid stenosis.  Aortic Valve: The aortic valve is tricuspid. Aortic valve regurgitation is not visualized. No aortic stenosis is present.  Pulmonic Valve: The pulmonic valve was grossly normal. Pulmonic valve regurgitation is not visualized. No evidence of pulmonic stenosis.  Aorta: The aortic root, ascending aorta, aortic arch and descending aorta are all structurally normal, with no evidence of dilitation or obstruction.  Venous: The inferior vena cava is dilated in size with greater than 50% respiratory variability, suggesting right atrial pressure of 8 mmHg.  IAS/Shunts: No atrial level shunt detected by  color flow Doppler.   LEFT VENTRICLE PLAX 2D LVIDd:         4.06 cm   Diastology LVIDs:         2.46 cm   LV e' medial:    8.65 cm/s LV PW:         1.35 cm   LV E/e' medial:  9.9 LV IVS:        1.42 cm   LV e' lateral:   8.98 cm/s LVOT diam:     1.80 cm   LV E/e' lateral: 9.6 LV SV:         45 LV SV Index:   22        2D Longitudinal Strain LVOT Area:     2.54 cm  2D Strain GLS (A2C):   -23.0 % 2D Strain GLS (A3C):   -22.2 % 2D Strain GLS (A4C):   -23.2 % 2D Strain GLS Avg:     -22.8 %  3D Volume EF: 3D EF:        47 % LV EDV:       120 ml LV ESV:       64 ml LV SV:        56 ml  RIGHT VENTRICLE             IVC RV Basal diam:  3.20 cm     IVC diam: 2.20 cm RV S prime:     13.95 cm/s TAPSE (M-mode): 2.4 cm  LEFT ATRIUM             Index        RIGHT ATRIUM           Index LA diam:        4.20 cm 2.08 cm/m   RA Area:     13.70 cm LA Vol (A2C):   36.7 ml 18.13 ml/m  RA Volume:   31.70 ml  15.66 ml/m LA Vol (A4C):   25.3 ml 12.50 ml/m LA Biplane Vol: 32.1 ml 15.86 ml/m AORTIC VALVE LVOT Vmax:   83.00 cm/s LVOT Vmean:  63.450 cm/s LVOT VTI:    0.178 m  AORTA Ao Root diam: 3.50 cm Ao Asc diam:  3.50 cm  MITRAL VALVE MV Area (PHT): 3.53 cm  SHUNTS MV Decel Time: 215 msec    Systemic VTI:  0.18 m MV E velocity: 85.90 cm/s  Systemic Diam: 1.80 cm MV A velocity: 58.20 cm/s MV E/A ratio:  1.48  Buford Dresser MD Electronically signed by Buford Dresser MD Signature Date/Time: 03/09/2022/4:50:03 PM    Final    MONITORS  LONG TERM MONITOR (3-14 DAYS) 03/25/2022  Narrative   Patient had a minimum heart rate of 51 bpm, maximum heart rate of 222 bpm, and average heart rate of 72 bpm.   Predominant underlying rhythm was sinus rhythm.   Paroxysmal SVT has occurred lasting 19 beats at longest with a max rate of 222 bpm at fastest.   Rare NSVT  lasting 8 beats at longest with a max rate of 193 bpm at fastest.   Isolated PACs were rare (<1.0%).    Isolated PVCs were rare (<1.0%).   Triggered and diary events associated with sinus rhythm, PACs, and PVCs.  Asymptomatic SVT.   CT SCANS  CT CORONARY MORPH W/CTA COR W/SCORE 07/16/2022  Addendum 07/16/2022  9:28 AM ADDENDUM REPORT: 07/16/2022 09:25  EXAM: OVER-READ INTERPRETATION  CT CHEST  The following report is an over-read performed by radiologist Dr. Collene Leyden [facility] on 07/16/2022. This over-read does not include interpretation of cardiac or coronary anatomy or pathology. The coronary CTA interpretation by the cardiologist is attached.  COMPARISON:  01/29/2022  FINDINGS: Heart is normal size. Aorta normal caliber. No adenopathy. No confluent airspace opacities or effusions. Linear scarring in the lung bases. No acute findings in the upper abdomen. Chest wall soft tissues are unremarkable. No acute bony abnormality.  IMPRESSION: No acute or significant extracardiac abnormality.   Electronically Signed By: Rolm Baptise M.D. On: 07/16/2022 09:25  Narrative HISTORY: 67 yo male with chest pain, nonspecific  EXAM: Cardiac/Coronary CTA  TECHNIQUE: The patient was scanned on a [employer].  PROTOCOL: A 120 kV prospective scan was triggered in the descending thoracic aorta at 111 HU's. Axial non-contrast 3 mm slices were carried out through the heart. The data set was analyzed on a dedicated work station and scored using the [place]. Gantry rotation speed was 250 msecs and collimation was .6 mm. Beta blockade and 0.8 mg of sl NTG was given. The 3D data set was reconstructed in 5% intervals of the 35-70 % of the R-R cycle. Diastolic phases were analyzed on a dedicated work station using MPR, MIP and VRT modes. The patient received 100 mL Omnipaque of contrast.  FINDINGS: Quality: Excellent, HR 54  Coronary calcium score: The patient's coronary artery calcium score is 71.6, which places the patient in the 66th  percentile.  Coronary arteries: Normal coronary origins.  Right dominance.  Right Coronary Artery: Dominant.  No disease.  Left Main Coronary Artery: Normal. Bifurcates into the LAD and LCx arteries.  Left Anterior Descending Coronary Artery: Large anterior artery which wraps around the apex. Minimal mixed proximal 1-24% stenosis. 2 small diagonal branches without disease.  Left Circumflex Artery: AV groove LCX vessel without disease. Large lateral OM1 branch without disease. Small OM2 branch without disease.  Aorta: Normal size, 35 mm at the mid ascending aorta (level of the PA bifurcation) measured double oblique. No calcifications. No dissection.  Aortic Valve: Trileaflet. No calcifications.  Other findings:  Normal pulmonary vein drainage into the left atrium.  Normal left atrial appendage without a thrombus.  Normal size of the pulmonary artery.  LV apical diverticulum (normal variant)  IMPRESSION: 1. Minimal mixed  non-obstructive CAD, CADRADS = 1.  2. Coronary calcium score of 71.6. This was 66th percentile for age and sex matched control.  3. Normal coronary origin with right dominance.  4. LV apical diverticulum (normal variant)  5. Consider non-coronary causes of chest pain  Electronically Signed: By: Pixie Casino M.D. On: 07/13/2022 13:23            Recent Labs: 08/21/2021: ALT 12; Hemoglobin 15.7; Platelets 109 07/05/2022: BUN 8; Creatinine, Ser 0.93; Potassium 4.5; Sodium 142  Recent Lipid Panel No results found for: "CHOL", "TRIG", "HDL", "CHOLHDL", "VLDL", "LDLCALC", "LDLDIRECT"      Physical Exam:    VS:  BP 138/80   Pulse 62   Ht '5\' 10"'$  (1.778 m)   Wt 182 lb (82.6 kg)   SpO2 97%   BMI 26.11 kg/m     Wt Readings from Last 3 Encounters:  07/16/22 182 lb (82.6 kg)  06/19/22 184 lb (83.5 kg)  03/19/22 185 lb (83.9 kg)    Gen: no distress  Neck: No JVD Cardiac: No Rubs or Gallops, no Murmur, RRR +2 radial pulses Respiratory:  Clear to auscultation bilaterally, normal effort, normal  respiratory rate GI: Soft, nontender, non-distended  MS: No  edema;  moves all extremities Integument: Skin feels warm Neuro:  At time of evaluation, alert and oriented to person/place/time/situation  Psych: Normal affect, patient feels well  ASSESSMENT:    1. Coronary artery disease involving native coronary artery of native heart without angina pectoris   2. HYPERTENSION, BENIGN   3. COPD with asthma ([place])     PLAN:     Minimal non obstructive CAD SAH trauamatic ASA - LDL direct today - reviewed CT with patient; apical diverticulum is normal variant finding (no aneurysm) - will reach out with neurology and start ASA if able   PAF s/ pAF ablation - no BB because of issues with Asthma - if recurrent will need to re-visit [facility]  History of COPD/asthma HTN - clinically suggestive of vasovagal syncope - will do two weeks trial of diltiazem with AMB BP checks, if no improvement return to [facility]  Six months  with me        Medication Adjustments/Labs and Tests Ordered: Current medicines are reviewed at length with the patient today.  Concerns regarding medicines are outlined above.  Orders Placed This Encounter  Procedures   LDL cholesterol, direct   No orders of the defined types were placed in this encounter.   Patient Instructions  Medication Instructions:  Your physician has recommended you make the following change in your medication:  HOLD: Diltiazem for 2 weeks monitor BP call in or send a my chart message with BP readings by FEB 14th, 2024  *If you need a refill on your cardiac medications before your next appointment, please call your pharmacy*   Lab Work: TODAY: LDL Direct If you have labs (blood work) drawn today and your tests are completely normal, you will receive your results only by: [place] (if you have MyChart) OR A paper copy in the mail If you have any lab test that is abnormal or  we need to change your treatment, we will call you to review the results.   Testing/Procedures: NONE   Follow-Up: At [facility], you and your health needs are our priority.  As part of our continuing mission to provide you with exceptional heart care, we have created designated Provider Care Teams.  These Care Teams include your primary Cardiologist (physician)  and Advanced Practice Providers (APPs -  Physician Assistants and Nurse Practitioners) who all work together to provide you with the care you need, when you need it.  We recommend signing up for the patient portal called "MyChart".  Sign up information is provided on this After Visit Summary.  MyChart is used to connect with patients for Virtual Visits (Telemedicine).  Patients are able to view lab/test results, encounter notes, upcoming appointments, etc.  Non-urgent messages can be sent to your provider as well.   To learn more about what you can do with MyChart, go to [email or web address].    Your next appointment:   6 month(s)  Provider:   Werner Lean, MD       Signed, Norman Lean, MD  07/16/2022 5:24 PM    [place]

## 2022-07-16 ENCOUNTER — Ambulatory Visit: Payer: Medicare PPO | Attending: Internal Medicine | Admitting: Internal Medicine

## 2022-07-16 ENCOUNTER — Encounter: Payer: Self-pay | Admitting: Internal Medicine

## 2022-07-16 ENCOUNTER — Ambulatory Visit: Payer: Self-pay | Admitting: Internal Medicine

## 2022-07-16 VITALS — BP 138/80 | HR 62 | Ht 70.0 in | Wt 182.0 lb

## 2022-07-16 DIAGNOSIS — I10 Essential (primary) hypertension: Secondary | ICD-10-CM

## 2022-07-16 DIAGNOSIS — J4489 Other specified chronic obstructive pulmonary disease: Secondary | ICD-10-CM

## 2022-07-16 DIAGNOSIS — I2510 Atherosclerotic heart disease of native coronary artery without angina pectoris: Secondary | ICD-10-CM

## 2022-07-16 NOTE — Patient Instructions (Signed)
Medication Instructions:  Your physician has recommended you make the following change in your medication:  HOLD: Diltiazem for 2 weeks monitor BP call in or send a my chart message with BP readings by FEB 14th, 2024  *If you need a refill on your cardiac medications before your next appointment, please call your pharmacy*   Lab Work: TODAY: LDL Direct If you have labs (blood work) drawn today and your tests are completely normal, you will receive your results only by: [place] (if you have MyChart) OR A paper copy in the mail If you have any lab test that is abnormal or we need to change your treatment, we will call you to review the results.   Testing/Procedures: NONE   Follow-Up: At [facility], you and your health needs are our priority.  As part of our continuing mission to provide you with exceptional heart care, we have created designated Provider Care Teams.  These Care Teams include your primary Cardiologist (physician) and Advanced Practice Providers (APPs -  Physician Assistants and Nurse Practitioners) who all work together to provide you with the care you need, when you need it.  We recommend signing up for the patient portal called "MyChart".  Sign up information is provided on this After Visit Summary.  MyChart is used to connect with patients for Virtual Visits (Telemedicine).  Patients are able to view lab/test results, encounter notes, upcoming appointments, etc.  Non-urgent messages can be sent to your provider as well.   To learn more about what you can do with MyChart, go to [email or web address].    Your next appointment:   6 month(s)  Provider:   Werner Lean, MD

## 2022-07-17 LAB — LDL CHOLESTEROL, DIRECT: LDL Direct: 106 mg/dL — ABNORMAL HIGH (ref 0–99)

## 2022-07-24 ENCOUNTER — Telehealth: Payer: Self-pay

## 2022-07-24 DIAGNOSIS — I2510 Atherosclerotic heart disease of native coronary artery without angina pectoris: Secondary | ICD-10-CM

## 2022-07-24 MED ORDER — ASPIRIN 81 MG PO TBEC: 81.0000 mg | DELAYED_RELEASE_TABLET | Freq: Every day | ORAL | 3 refills | Status: AC

## 2022-07-24 NOTE — Telephone Encounter (Signed)
The patient has been notified of the result and verbalized understanding.  All questions (if any) were answered. Bedelia Person Angelika Jerrett, RN 07/24/2022 3:58 PM   Pt does not want to start medication at this time.  Would like to modify diet and exercise habits before adding a new medication.  Will come in for FLP on 5/6.   Werner Lean, MD  Suzzanne Cloud, NP; Precious Gilding, RN Thanks so much Sarah,  Team- can we add aspirin 81 mg PO daily to his therapy for non obstructive CAD?  Thanks, MAC  Pt is willing to start Aspirin 81 mg PO QD.

## 2022-09-18 DIAGNOSIS — D510 Vitamin B12 deficiency anemia due to intrinsic factor deficiency: Secondary | ICD-10-CM | POA: Diagnosis not present

## 2022-09-18 DIAGNOSIS — I10 Essential (primary) hypertension: Secondary | ICD-10-CM | POA: Diagnosis not present

## 2022-09-18 DIAGNOSIS — D1803 Hemangioma of intra-abdominal structures: Secondary | ICD-10-CM | POA: Diagnosis not present

## 2022-09-18 DIAGNOSIS — I480 Paroxysmal atrial fibrillation: Secondary | ICD-10-CM | POA: Diagnosis not present

## 2022-09-18 DIAGNOSIS — E890 Postprocedural hypothyroidism: Secondary | ICD-10-CM | POA: Diagnosis not present

## 2022-09-18 DIAGNOSIS — I25119 Atherosclerotic heart disease of native coronary artery with unspecified angina pectoris: Secondary | ICD-10-CM | POA: Diagnosis not present

## 2022-09-18 DIAGNOSIS — J449 Chronic obstructive pulmonary disease, unspecified: Secondary | ICD-10-CM | POA: Diagnosis not present

## 2022-10-14 ENCOUNTER — Telehealth: Payer: Self-pay | Admitting: Internal Medicine

## 2022-10-14 NOTE — Telephone Encounter (Signed)
Received Heartflow data: Total plaque Volume 104 cubic mm.   Did you add a medication? No  If no, reason? Reason for not adding med: Other Planned for medication change due to result of CCTA (calcified plaque); patient declined (See 07/24/22 phone note)  Did you remove a medication? No  Did you increase the dosage of any medication? No  Did you decrease the dosage of any medication? No  Did you refer to a specialist (i.e. lipid clinic, preventive cardiology, endocrinology)? No  Has patient seen plaque report? No

## 2022-10-22 ENCOUNTER — Ambulatory Visit: Payer: Medicare PPO | Attending: Internal Medicine

## 2022-10-22 DIAGNOSIS — I2510 Atherosclerotic heart disease of native coronary artery without angina pectoris: Secondary | ICD-10-CM

## 2022-10-22 LAB — LIPID PANEL
Chol/HDL Ratio: 2.6 ratio (ref 0.0–5.0)
Cholesterol, Total: 198 mg/dL (ref 100–199)
HDL: 76 mg/dL (ref >39)
LDL Chol Calc (NIH): 104 mg/dL — ABNORMAL HIGH (ref 0–99)
Triglycerides: 100 mg/dL (ref 0–149)
VLDL Cholesterol Cal: 18 mg/dL (ref 5–40)

## 2022-12-06 DIAGNOSIS — E890 Postprocedural hypothyroidism: Secondary | ICD-10-CM | POA: Diagnosis not present

## 2022-12-06 DIAGNOSIS — E559 Vitamin D deficiency, unspecified: Secondary | ICD-10-CM | POA: Diagnosis not present

## 2022-12-06 DIAGNOSIS — I509 Heart failure, unspecified: Secondary | ICD-10-CM | POA: Diagnosis not present

## 2022-12-06 DIAGNOSIS — R739 Hyperglycemia, unspecified: Secondary | ICD-10-CM | POA: Diagnosis not present

## 2022-12-06 DIAGNOSIS — Z1212 Encounter for screening for malignant neoplasm of rectum: Secondary | ICD-10-CM | POA: Diagnosis not present

## 2022-12-06 DIAGNOSIS — E538 Deficiency of other specified B group vitamins: Secondary | ICD-10-CM | POA: Diagnosis not present

## 2022-12-06 DIAGNOSIS — I10 Essential (primary) hypertension: Secondary | ICD-10-CM | POA: Diagnosis not present

## 2022-12-13 DIAGNOSIS — I10 Essential (primary) hypertension: Secondary | ICD-10-CM | POA: Diagnosis not present

## 2022-12-13 DIAGNOSIS — Z0000 Encounter for general adult medical examination without abnormal findings: Secondary | ICD-10-CM | POA: Diagnosis not present

## 2022-12-13 DIAGNOSIS — I25119 Atherosclerotic heart disease of native coronary artery with unspecified angina pectoris: Secondary | ICD-10-CM | POA: Diagnosis not present

## 2022-12-13 DIAGNOSIS — J449 Chronic obstructive pulmonary disease, unspecified: Secondary | ICD-10-CM | POA: Diagnosis not present

## 2022-12-13 DIAGNOSIS — E890 Postprocedural hypothyroidism: Secondary | ICD-10-CM | POA: Diagnosis not present

## 2022-12-13 DIAGNOSIS — I480 Paroxysmal atrial fibrillation: Secondary | ICD-10-CM | POA: Diagnosis not present

## 2022-12-13 DIAGNOSIS — D510 Vitamin B12 deficiency anemia due to intrinsic factor deficiency: Secondary | ICD-10-CM | POA: Diagnosis not present

## 2022-12-13 DIAGNOSIS — D696 Thrombocytopenia, unspecified: Secondary | ICD-10-CM | POA: Diagnosis not present

## 2022-12-13 DIAGNOSIS — R82998 Other abnormal findings in urine: Secondary | ICD-10-CM | POA: Diagnosis not present

## 2023-01-16 ENCOUNTER — Encounter: Payer: Self-pay | Admitting: Internal Medicine

## 2023-01-16 ENCOUNTER — Ambulatory Visit: Payer: Medicare PPO | Attending: Internal Medicine | Admitting: Internal Medicine

## 2023-01-16 VITALS — BP 120/70 | HR 74 | Ht 70.0 in | Wt 179.2 lb

## 2023-01-16 DIAGNOSIS — J4489 Other specified chronic obstructive pulmonary disease: Secondary | ICD-10-CM | POA: Diagnosis not present

## 2023-01-16 DIAGNOSIS — I2510 Atherosclerotic heart disease of native coronary artery without angina pectoris: Secondary | ICD-10-CM

## 2023-01-16 DIAGNOSIS — I4819 Other persistent atrial fibrillation: Secondary | ICD-10-CM | POA: Diagnosis not present

## 2023-01-16 DIAGNOSIS — E782 Mixed hyperlipidemia: Secondary | ICD-10-CM

## 2023-01-16 NOTE — Patient Instructions (Signed)
Medication Instructions:  Your physician has recommended you make the following change in your medication:  REMOVED: diltiazem from your medication list  *If you need a refill on your cardiac medications before your next appointment, please call your pharmacy*   Lab Work: IN 4 MONTHS: FLP, Lipoprotein A (nothing to eat or drink 8 hours prior except water)   If you have labs (blood work) drawn today and your tests are completely normal, you will receive your results only by: MyChart Message (if you have MyChart) OR A paper copy in the mail If you have any lab test that is abnormal or we need to change your treatment, we will call you to review the results.   Testing/Procedures: NONE   Follow-Up: At [facility], you and your health needs are our priority.  As part of our continuing mission to provide you with exceptional heart care, we have created designated Provider Care Teams.  These Care Teams include your primary Cardiologist (physician) and Advanced Practice Providers (APPs -  Physician Assistants and Nurse Practitioners) who all work together to provide you with the care you need, when you need it.   Your next appointment:   1 year(s)  Provider:   Christell Constant, MD

## 2023-01-16 NOTE — Progress Notes (Signed)
Cardiology Office Note:    Date:  01/16/2023   ID:  [name], DOB 05-26-1956, MRN [id number]  PCP:  Adrian Prince, MD   [facility] Providers Cardiologist:  Christell Constant, MD     Referring MD: Adrian Prince, MD   CC:CAD prevention f/u  History of Present Illness:    [name] is a 67 y.o. male with a hx of life threatening hyperthyroidism HFpEF, AF without ablation 2010, and COPD who presents after syncopal episode.  Has been some what lost to follow up since 2020 tele-visit from the pandemic. 2023: hx of traumatic SAH. 2024: Concern for chest pain- called in.  Had CCTA.  Minimal non obstructive CAC- no evidence of cardiac discomfort for symptoms.  Did not want to start more aggressive cholesterol regimen  Patient notes that he is doing well.   Since last visit notes that he had symptomatic hypotension on diltiazem . There are no interval hospital/ED visit.   EKG showed PACs and PVCs  No chest pain or pressure .  No SOB/DOE and no PND/Orthopnea.  No weight gain or leg swelling.  No palpitations or syncope except on AV nodal agents.  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  He did not go because of work stress.  He is not a medication first type of guy.    Past Medical History:  Diagnosis Date   CHF (congestive heart failure) (HCC)    Chronic hepatitis C (HCC)    COPD (chronic obstructive pulmonary disease) (HCC)    Dizziness    Hepatitis C    Hyperthyroidism    Syncope and collapse    Thrombocytopenia (HCC)     Past Surgical History:  Procedure Laterality Date   COLONOSCOPY  10/27/2015   POLYPECTOMY      Current Medications: Current Meds  Medication Sig   albuterol (VENTOLIN HFA) 108 (90 Base) MCG/ACT inhaler Inhale 2 puffs into the lungs every 6 (six) hours as needed for wheezing or shortness of breath.   aspirin EC 81 MG tablet Take 1 tablet (81 mg total) by mouth daily. Swallow whole.    Cholecalciferol 50 MCG (2000 UT) TABS daily.   Cyanocobalamin (VITAMIN B-12) 5000 MCG SUBL daily.   levothyroxine (SYNTHROID, LEVOTHROID) 100 MCG tablet Take 100 mcg by mouth. 6 days 1 tab/1 1/2 tab on the 7th day   SYMBICORT 160-4.5 MCG/ACT inhaler Inhale 2 puffs into the lungs in the morning and at bedtime. USE 2 INHALATIONS BY MOUTH  INTO THE LUNGS TWICE DAILY   [DISCONTINUED] Zinc Gluconate 100 MG TABS Take by mouth daily.     Allergies:   Atenolol, Methimazole, Other, and Theophyllines   Social History   Socioeconomic History   Marital status: Married    Spouse name: Not on file   Number of children: 6   Years of education: Not on file   Highest education level: Not on file  Occupational History   Not on file  Tobacco Use   Smoking status: Former    Current packs/day: 0.00    Average packs/day: 1 pack/day for 20.0 years (20.0 ttl pk-yrs)    Types: Cigarettes    Start date: 06/18/1966    Quit date: 06/18/1986    Years since quitting: 36.6   Smokeless tobacco: Never  Vaping Use   Vaping status: Never Used  Substance and Sexual Activity   Alcohol use: No   Drug use: No   Sexual activity:  Not on file  Other Topics Concern   Not on file  Social History Narrative   Lives with wife   Caffeine 2 a day   Social Determinants of Health   Financial Resource Strain: Not on file  Food Insecurity: Not on file  Transportation Needs: Not on file  Physical Activity: Not on file  Stress: Not on file  Social Connections: Not on file    Family History: The patient's family history includes Diabetes in his mother. There is no history of Colon cancer, Colon polyps, Esophageal cancer, Rectal cancer, or Stomach cancer.  ROS:   Please see the history of present illness.     EKGs/Labs/Other Studies Reviewed:    The following studies were reviewed today:   Cardiac Studies & Procedures     STRESS TESTS  MYOCARDIAL PERFUSION IMAGING 10/27/2020  Narrative  The left ventricular  ejection fraction is mildly decreased (45-54%).  Nuclear stress EF: 53%.  There was no ST segment deviation noted during stress.  There is a small defect of moderate severity present in the basal inferior and apex location. The defect is non-reversible. Given normal wall motion this is consistent with attenuation artifact and increased extra cardiac activity. No ischemia noted.  This is a low risk study.   ECHOCARDIOGRAM  ECHOCARDIOGRAM COMPLETE 03/09/2022  Narrative ECHOCARDIOGRAM REPORT    Patient Name:   [name] Date of Exam: 03/09/2022 Medical Rec #:  [id number]      Height:       70.0 in Accession #:    [id number]     Weight:       186.0 lb Date of Birth:  December 19, 1955     BSA:          2.024 m Patient Age:    65 years       BP:           124/70 mmHg Patient Gender: M              HR:           61 bpm. Exam Location:  [address]  Procedure: 2D Echo, 3D Echo, Cardiac Doppler, Color Doppler and Strain Analysis  Indications:    R55 Syncope  History:        Patient has prior history of Echocardiogram examinations, most recent 07/08/2012. CHF, COPD; Signs/Symptoms:Syncope. Hyperthyroidism.  Sonographer:    Daphine Deutscher RDCS Referring Phys: [id number] [facility] Mykael Batz  IMPRESSIONS   1. Left ventricular ejection fraction, by estimation, is 55 to 60%. The left ventricle has normal function. The left ventricle has no regional wall motion abnormalities. There is moderate left ventricular hypertrophy. Left ventricular diastolic parameters were normal. The average left ventricular global longitudinal strain is -22.8 %. The global longitudinal strain is normal. 2. Right ventricular systolic function is normal. The right ventricular size is normal. 3. The mitral valve is normal in structure. Trivial mitral valve regurgitation. No evidence of mitral stenosis. 4. The aortic valve is tricuspid. Aortic valve regurgitation is not visualized. No aortic stenosis is  present. 5. The inferior vena cava is dilated in size with >50% respiratory variability, suggesting right atrial pressure of 8 mmHg.  Comparison(s): No significant change from prior study.  Conclusion(s)/Recommendation(s): Otherwise normal echocardiogram, with minor abnormalities described in the report.  FINDINGS Left Ventricle: Left ventricular ejection fraction, by estimation, is 55 to 60%. The left ventricle has normal function. The left ventricle has no regional wall motion abnormalities. The average left ventricular global longitudinal strain  is -22.8 %. The global longitudinal strain is normal. 3D left ventricular ejection fraction analysis performed but not reported based on interpreter judgement due to suboptimal tracking. The left ventricular internal cavity size was normal in size. There is moderate left ventricular hypertrophy. Left ventricular diastolic parameters were normal.  Right Ventricle: The right ventricular size is normal. No increase in right ventricular wall thickness. Right ventricular systolic function is normal.  Left Atrium: Left atrial size was normal in size.  Right Atrium: Right atrial size was normal in size.  Pericardium: There is no evidence of pericardial effusion.  Mitral Valve: The mitral valve is normal in structure. Trivial mitral valve regurgitation. No evidence of mitral valve stenosis.  Tricuspid Valve: The tricuspid valve is normal in structure. Tricuspid valve regurgitation is trivial. No evidence of tricuspid stenosis.  Aortic Valve: The aortic valve is tricuspid. Aortic valve regurgitation is not visualized. No aortic stenosis is present.  Pulmonic Valve: The pulmonic valve was grossly normal. Pulmonic valve regurgitation is not visualized. No evidence of pulmonic stenosis.  Aorta: The aortic root, ascending aorta, aortic arch and descending aorta are all structurally normal, with no evidence of dilitation or obstruction.  Venous: The  inferior vena cava is dilated in size with greater than 50% respiratory variability, suggesting right atrial pressure of 8 mmHg.  IAS/Shunts: No atrial level shunt detected by color flow Doppler.   LEFT VENTRICLE PLAX 2D LVIDd:         4.06 cm   Diastology LVIDs:         2.46 cm   LV e' medial:    8.65 cm/s LV PW:         1.35 cm   LV E/e' medial:  9.9 LV IVS:        1.42 cm   LV e' lateral:   8.98 cm/s LVOT diam:     1.80 cm   LV E/e' lateral: 9.6 LV SV:         45 LV SV Index:   22        2D Longitudinal Strain LVOT Area:     2.54 cm  2D Strain GLS (A2C):   -23.0 % 2D Strain GLS (A3C):   -22.2 % 2D Strain GLS (A4C):   -23.2 % 2D Strain GLS Avg:     -22.8 %  3D Volume EF: 3D EF:        47 % LV EDV:       120 ml LV ESV:       64 ml LV SV:        56 ml  RIGHT VENTRICLE             IVC RV Basal diam:  3.20 cm     IVC diam: 2.20 cm RV S prime:     13.95 cm/s TAPSE (M-mode): 2.4 cm  LEFT ATRIUM             Index        RIGHT ATRIUM           Index LA diam:        4.20 cm 2.08 cm/m   RA Area:     13.70 cm LA Vol (A2C):   36.7 ml 18.13 ml/m  RA Volume:   31.70 ml  15.66 ml/m LA Vol (A4C):   25.3 ml 12.50 ml/m LA Biplane Vol: 32.1 ml 15.86 ml/m AORTIC VALVE LVOT Vmax:   83.00 cm/s LVOT Vmean:  63.450 cm/s LVOT VTI:  0.178 m  AORTA Ao Root diam: 3.50 cm Ao Asc diam:  3.50 cm  MITRAL VALVE MV Area (PHT): 3.53 cm    SHUNTS MV Decel Time: 215 msec    Systemic VTI:  0.18 m MV E velocity: 85.90 cm/s  Systemic Diam: 1.80 cm MV A velocity: 58.20 cm/s MV E/A ratio:  1.48  Jodelle Red MD Electronically signed by Jodelle Red MD Signature Date/Time: 03/09/2022/4:50:03 PM    Final    MONITORS  LONG TERM MONITOR (3-14 DAYS) 03/22/2022  Narrative   Patient had a minimum heart rate of 51 bpm, maximum heart rate of 222 bpm, and average heart rate of 72 bpm.   Predominant underlying rhythm was sinus rhythm.   Paroxysmal SVT has occurred lasting 19  beats at longest with a max rate of 222 bpm at fastest.   Rare NSVT  lasting 8 beats at longest with a max rate of 193 bpm at fastest.   Isolated PACs were rare (<1.0%).   Isolated PVCs were rare (<1.0%).   Triggered and diary events associated with sinus rhythm, PACs, and PVCs.  Asymptomatic SVT.   CT SCANS  CT CORONARY MORPH W/CTA COR W/SCORE 07/13/2022  Addendum 07/16/2022  9:28 AM ADDENDUM REPORT: 07/16/2022 09:25  EXAM: OVER-READ INTERPRETATION  CT CHEST  The following report is an over-read performed by radiologist Dr. Noe Gens [facility] on 07/16/2022. This over-read does not include interpretation of cardiac or coronary anatomy or pathology. The coronary CTA interpretation by the cardiologist is attached.  COMPARISON:  01/29/2022  FINDINGS: Heart is normal size. Aorta normal caliber. No adenopathy. No confluent airspace opacities or effusions. Linear scarring in the lung bases. No acute findings in the upper abdomen. Chest wall soft tissues are unremarkable. No acute bony abnormality.  IMPRESSION: No acute or significant extracardiac abnormality.   Electronically Signed By: Charlett Nose M.D. On: 07/16/2022 09:25  Narrative HISTORY: 67 yo male with chest pain, nonspecific  EXAM: Cardiac/Coronary CTA  TECHNIQUE: The patient was scanned on a [employer].  PROTOCOL: A 120 kV prospective scan was triggered in the descending thoracic aorta at 111 HU's. Axial non-contrast 3 mm slices were carried out through the heart. The data set was analyzed on a dedicated work station and scored using the Agatson method. Gantry rotation speed was 250 msecs and collimation was .6 mm. Beta blockade and 0.8 mg of sl NTG was given. The 3D data set was reconstructed in 5% intervals of the 35-70 % of the R-R cycle. Diastolic phases were analyzed on a dedicated work station using MPR, MIP and VRT modes. The patient received 100 mL Omnipaque of  contrast.  FINDINGS: Quality: Excellent, HR 54  Coronary calcium score: The patient's coronary artery calcium score is 71.6, which places the patient in the 66th percentile.  Coronary arteries: Normal coronary origins.  Right dominance.  Right Coronary Artery: Dominant.  No disease.  Left Main Coronary Artery: Normal. Bifurcates into the LAD and LCx arteries.  Left Anterior Descending Coronary Artery: Large anterior artery which wraps around the apex. Minimal mixed proximal 1-24% stenosis. 2 small diagonal branches without disease.  Left Circumflex Artery: AV groove LCX vessel without disease. Large lateral OM1 branch without disease. Small OM2 branch without disease.  Aorta: Normal size, 35 mm at the mid ascending aorta (level of the PA bifurcation) measured double oblique. No calcifications. No dissection.  Aortic Valve: Trileaflet. No calcifications.  Other findings:  Normal pulmonary vein drainage into the left atrium.  Normal left atrial appendage without a thrombus.  Normal size of the pulmonary artery.  LV apical diverticulum (normal variant)  IMPRESSION: 1. Minimal mixed non-obstructive CAD, CADRADS = 1.  2. Coronary calcium score of 71.6. This was 66th percentile for age and sex matched control.  3. Normal coronary origin with right dominance.  4. LV apical diverticulum (normal variant)  5. Consider non-coronary causes of chest pain  Electronically Signed: By: Chrystie Nose M.D. On: 07/13/2022 13:23            Recent Labs: 07/05/2022: BUN 8; Creatinine, Ser 0.93; Potassium 4.5; Sodium 142  Recent Lipid Panel    Component Value Date/Time   CHOL 198 10/22/2022 0859   TRIG 100 10/22/2022 0859   HDL 76 10/22/2022 0859   CHOLHDL 2.6 10/22/2022 0859   LDLCALC 104 (H) 10/22/2022 0859   LDLDIRECT 106 (H) 07/16/2022 1646        Physical Exam:    VS:  BP 120/70   Pulse 74   Ht 5\' 10"  (1.778 m)   Wt 179 lb 3.2 oz (81.3 kg)   SpO2 95%    BMI 25.71 kg/m     Wt Readings from Last 3 Encounters:  01/16/23 179 lb 3.2 oz (81.3 kg)  07/16/22 182 lb (82.6 kg)  06/19/22 184 lb (83.5 kg)    Gen: no distress  Neck: No JVD Cardiac: No Rubs or Gallops, no murmur, Largely regular +2 radial pulses Respiratory: Clear to auscultation bilaterally, normal effort, normal  respiratory rate GI: Soft, nontender, non-distended  MS: No  edema;  moves all extremities Integument: Skin feels warm Neuro:  At time of evaluation, alert and oriented to person/place/time/situation  Psych: Normal affect, patient feels well  ASSESSMENT:    1. Coronary artery disease involving native coronary artery of native heart without angina pectoris   2. COPD with asthma (HCC)   3. Persistent atrial fibrillation (HCC)   4. Mixed hyperlipidemia      PLAN:    Minimal non obstructive CAD SAH trauamatic ASA Small apical diverticulum with no evidence of aneurysm or apical HCM - if sx will need to see neuro for clearance for [facility] - presently will start back at the gym; in 4 months if above goal he is amenable to starting rosuvastatin 5 mg; lebs in 4 months  PAF s/ pAF ablation PAC, PVCs, and SVT - he is asymptomatic - no further AF - no BB because of issues with Asthma - if recurrent will need to re-visit [facility] - he was intolerant to diltiazem - if sx, will trial verapamil  History of COPD/asthma HTN - presently controlled  One year        Medication Adjustments/Labs and Tests Ordered: Current medicines are reviewed at length with the patient today.  Concerns regarding medicines are outlined above.  Orders Placed This Encounter  Procedures   Lipid panel   Lipoprotein A (LPA)   EKG 12-Lead   No orders of the defined types were placed in this encounter.   Patient Instructions  Medication Instructions:  Your physician has recommended you make the following change in your medication:  REMOVED: diltiazem from your medication list  *If you  need a refill on your cardiac medications before your next appointment, please call your pharmacy*   Lab Work: IN 4 MONTHS: FLP, Lipoprotein A (nothing to eat or drink 8 hours prior except water)   If you have labs (blood work) drawn today and your tests are completely normal, you  will receive your results only by: MyChart Message (if you have MyChart) OR A paper copy in the mail If you have any lab test that is abnormal or we need to change your treatment, we will call you to review the results.   Testing/Procedures: NONE   Follow-Up: At [facility], you and your health needs are our priority.  As part of our continuing mission to provide you with exceptional heart care, we have created designated Provider Care Teams.  These Care Teams include your primary Cardiologist (physician) and Advanced Practice Providers (APPs -  Physician Assistants and Nurse Practitioners) who all work together to provide you with the care you need, when you need it.   Your next appointment:   1 year(s)  Provider:   Christell Constant, MD        Signed, Christell Constant, MD  01/16/2023 5:23 PM    [facility]

## 2023-01-20 DIAGNOSIS — D1801 Hemangioma of skin and subcutaneous tissue: Secondary | ICD-10-CM | POA: Diagnosis not present

## 2023-01-22 ENCOUNTER — Other Ambulatory Visit: Payer: Self-pay | Admitting: Orthopedic Surgery

## 2023-01-22 DIAGNOSIS — D1801 Hemangioma of skin and subcutaneous tissue: Secondary | ICD-10-CM | POA: Diagnosis not present

## 2023-01-22 DIAGNOSIS — R2232 Localized swelling, mass and lump, left upper limb: Secondary | ICD-10-CM | POA: Diagnosis not present

## 2023-01-22 DIAGNOSIS — D1809 Hemangioma of other sites: Secondary | ICD-10-CM | POA: Diagnosis not present

## 2023-04-09 ENCOUNTER — Telehealth: Payer: Self-pay | Admitting: Emergency Medicine

## 2023-04-09 NOTE — Telephone Encounter (Signed)
Pt will like to sign up for the AZ&Me program for [employer]

## 2023-04-11 NOTE — Telephone Encounter (Signed)
X1 for patient.

## 2023-04-16 NOTE — Telephone Encounter (Signed)
Called and spoke with pt. [name] is interested in patient assistance for his symbicort inhaler. Papers have been printed and left at the front. Pt verbalized understanding nfn

## 2023-04-23 ENCOUNTER — Other Ambulatory Visit: Payer: Self-pay | Admitting: Emergency Medicine

## 2023-05-22 ENCOUNTER — Ambulatory Visit: Payer: Medicare PPO | Attending: Internal Medicine

## 2023-05-22 DIAGNOSIS — I2510 Atherosclerotic heart disease of native coronary artery without angina pectoris: Secondary | ICD-10-CM | POA: Diagnosis not present

## 2023-05-23 LAB — LIPID PANEL
Chol/HDL Ratio: 2.6 {ratio} (ref 0.0–5.0)
Cholesterol, Total: 188 mg/dL (ref 100–199)
HDL: 71 mg/dL (ref >39)
LDL Chol Calc (NIH): 101 mg/dL — ABNORMAL HIGH (ref 0–99)
Triglycerides: 89 mg/dL (ref 0–149)
VLDL Cholesterol Cal: 16 mg/dL (ref 5–40)

## 2023-05-23 LAB — LIPOPROTEIN A (LPA): Lipoprotein (a): 129 nmol/L — ABNORMAL HIGH (ref <75.0)

## 2023-05-29 ENCOUNTER — Ambulatory Visit: Payer: Medicare PPO | Admitting: Emergency Medicine

## 2023-05-29 ENCOUNTER — Encounter: Payer: Self-pay | Admitting: Emergency Medicine

## 2023-05-29 VITALS — BP 126/74 | HR 60 | Temp 98.3°F | Ht 70.0 in | Wt 180.0 lb

## 2023-05-29 DIAGNOSIS — E782 Mixed hyperlipidemia: Secondary | ICD-10-CM | POA: Diagnosis not present

## 2023-05-29 DIAGNOSIS — J449 Chronic obstructive pulmonary disease, unspecified: Secondary | ICD-10-CM | POA: Diagnosis not present

## 2023-05-29 DIAGNOSIS — D510 Vitamin B12 deficiency anemia due to intrinsic factor deficiency: Secondary | ICD-10-CM | POA: Diagnosis not present

## 2023-05-29 DIAGNOSIS — D696 Thrombocytopenia, unspecified: Secondary | ICD-10-CM | POA: Diagnosis not present

## 2023-05-29 DIAGNOSIS — I10 Essential (primary) hypertension: Secondary | ICD-10-CM | POA: Diagnosis not present

## 2023-05-29 DIAGNOSIS — J4489 Other specified chronic obstructive pulmonary disease: Secondary | ICD-10-CM | POA: Diagnosis not present

## 2023-05-29 DIAGNOSIS — R918 Other nonspecific abnormal finding of lung field: Secondary | ICD-10-CM

## 2023-05-29 DIAGNOSIS — I25119 Atherosclerotic heart disease of native coronary artery with unspecified angina pectoris: Secondary | ICD-10-CM | POA: Diagnosis not present

## 2023-05-29 DIAGNOSIS — E890 Postprocedural hypothyroidism: Secondary | ICD-10-CM | POA: Diagnosis not present

## 2023-05-29 DIAGNOSIS — I480 Paroxysmal atrial fibrillation: Secondary | ICD-10-CM | POA: Diagnosis not present

## 2023-05-29 MED ORDER — BREZTRI AEROSPHERE 160-9-4.8 MCG/ACT IN AERO: 2.0000 | INHALATION_SPRAY | Freq: Two times a day (BID) | RESPIRATORY_TRACT | Status: DC

## 2023-05-29 NOTE — Assessment & Plan Note (Signed)
Symptoms are a little difficult to interpret because they wax and wane.  No wheezing on exam today.  He does feel that his functional capacity is more limited and that there is room for improvement.  I think it would be reasonable to try changing his Symbicort to [facility] to see if he gets more benefit.

## 2023-05-29 NOTE — Assessment & Plan Note (Signed)
Noted on CT scan of the chest done in [place] when he was admitted to the hospital for syncope.  We will plan a follow-up scan now.  If no concerning nodules then he will not need serial exams.

## 2023-05-29 NOTE — Addendum Note (Signed)
Addended byClyda Greener M on: 05/29/2023 03:48 PM   Modules accepted: Orders

## 2023-05-29 NOTE — Progress Notes (Signed)
History of Present Illness:   [name] 03/07/2022 --[name] is 67, returns today for follow-up of history of COPD/asthma.  Hx hyperthyroidism, A fib/ablation. He has severe obstruction on pulmonary function testing.  He remains active.  Has been managed on Symbicort, uses albuterol very rarely.  He feels that his breathing is doing well - he had mild COVID in August, mainly was fatigued. No breathing sx with this.  He had an episode of chest discomfort that began in mid February after he was exposed to cleaning solutions.  Ultimately seen in the ED 08/21/2021 and had a reassuring evaluation.  Today he reports that he had a syncopal episode in in [place], trauma and was hospitalized. He had a Ct chest that made mention of pulmonary nodular disease. I don't have those films available right now, but he has a copy of the disk at home. He is following up w Neurology.   [name] 05/29/2023 --follow-up visit for 67 year old man with a history of COPD/asthma.  Also with hypothyroidism, atrial fibrillation post ablation.  His PFTs show severe obstruction.  Is been managed on Symbicort.  He had a pulmonary nodule noted on a hospitalization in [place] in 2023, showed scattered 2 to 3 mm nodules in the left lower lobe and right lower lobe.  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  He has been a little more SOB - was exposed to some dust when traveling recently to [place]. Had to use his albuterol then - has used it less than 5 times total. He has cough for about 2 weeks, can be random, sometimes when laying down. Non-productive. No nasal gtt or congestion. No reflux sx. He likes symbicort but feels that he should be able to do more.      Vitals:   05/29/23 1517  BP: 126/74  Pulse: 60  Temp: 98.3 F (36.8 C)  TempSrc: Oral  SpO2: 97%  Weight: 180 lb (81.6 kg)  Height: 5\' 10"  (1.778 m)    Gen: Pleasant, well-nourished, in no distress,  normal  affect  ENT: No lesions,  mouth clear,  oropharynx clear, no postnasal drip  Neck: No JVD, no stridor  Lungs: No use of accessory muscles, clear without rales or rhonchi, no wheeze on regular breath or forced exp.   Cardiovascular: RRR, heart sounds normal, no murmur or gallops, no peripheral edema  Musculoskeletal: No deformities, no cyanosis or clubbing  Neuro: alert, non focal  Skin: Warm, no lesions or rashes    COPD with asthma (HCC) Symptoms are a little difficult to interpret because they wax and wane.  No wheezing on exam today.  He does feel that his functional capacity is more limited and that there is room for improvement.  I think it would be reasonable to try changing his Symbicort to [facility] to see if he gets more benefit.  Pulmonary nodules Noted on CT scan of the chest done in [place] when he was admitted to the hospital for syncope.  We will plan a follow-up scan now.  If no concerning nodules then he will not need serial exams.    Levy Pupa, MD, PhD 05/29/2023, 3:37 PM [facility] [phone] or if no answer [phone]

## 2023-05-29 NOTE — Patient Instructions (Signed)
We will temporarily stop her Symbicort. Please try starting Breztri 2 puffs twice a day.  Rinse and gargle after using.  Keep track of how this medication helps your breathing.  If you benefit then please message our office so we can send a prescription to your pharmacy and continue it. Keep albuterol available to use 2 puffs if needed for shortness of breath, chest tightness, wheezing. We will perform a CT scan of the chest to follow small pulmonary nodules and compare with your prior imaging from [place] and 2023 Follow Dr. Delton Coombes in about 6 weeks to review your CT scan of the chest

## 2023-06-14 ENCOUNTER — Telehealth: Payer: Self-pay | Admitting: Emergency Medicine

## 2023-06-14 NOTE — Telephone Encounter (Signed)
Pt calling from the Symbicort to [facility] due to the samples being more helpful. Pt will need a 90-day supply Pharmacy: [facility] - [place], [place] - [address]

## 2023-06-17 MED ORDER — BREZTRI AEROSPHERE 160-9-4.8 MCG/ACT IN AERO: 2.0000 | INHALATION_SPRAY | Freq: Two times a day (BID) | RESPIRATORY_TRACT | 3 refills | Status: DC

## 2023-06-17 NOTE — Telephone Encounter (Signed)
I sent a prescription to [facility] pharmacy for 42-month supply

## 2023-06-20 ENCOUNTER — Ambulatory Visit
Admission: RE | Admit: 2023-06-20 | Discharge: 2023-06-20 | Disposition: A | Discharge status: Home / Self Care | Payer: Medicare PPO | Source: Ambulatory Visit | Attending: Emergency Medicine | Admitting: Emergency Medicine

## 2023-06-20 DIAGNOSIS — R918 Other nonspecific abnormal finding of lung field: Secondary | ICD-10-CM | POA: Diagnosis not present

## 2023-06-21 NOTE — Telephone Encounter (Signed)
 Patient states [name] needs prior authorization. Number for prior authorization is [phone]. Pharmacy is [facility] mail order. Patient phone number is [phone].

## 2023-06-21 NOTE — Telephone Encounter (Signed)
 Routing to  PA team

## 2023-06-24 ENCOUNTER — Telehealth: Payer: Self-pay | Admitting: Emergency Medicine

## 2023-06-24 NOTE — Telephone Encounter (Signed)
 Patient is dropping off AZ&Me form that needs to be completed by his provider Dr.Byrum. The form will be placed in Dr.Byrum's box.

## 2023-06-24 NOTE — Telephone Encounter (Signed)
 Will send as a Fyi to Dr.Byrum, pt was provided with samples. [name]

## 2023-06-24 NOTE — Telephone Encounter (Signed)
 Thank you :)

## 2023-06-25 ENCOUNTER — Other Ambulatory Visit (HOSPITAL_COMMUNITY): Payer: Self-pay

## 2023-06-25 NOTE — Telephone Encounter (Signed)
 Per test claim, no PA is required. Ran test claim for a 30 day supply for a co-pay of $297.00. This will allow the patient to meet his deductible amount and following co-pay for 30 days will be $47.00. But must pay the higher price first.   To get approval, pharmacy will need to do an override since patient just picked up duplicate therapy (Symbicort ) on 06-20-2023 for 90 day supply

## 2023-06-26 NOTE — Telephone Encounter (Signed)
 I called and spoke with the pt and notified of response per pharm team  He states he has filled out pt assistance forms and will see if he gets approved and if not, will pay higher copay to meet deductible Nothing further needed

## 2023-07-09 ENCOUNTER — Encounter: Payer: Self-pay | Admitting: Internal Medicine

## 2023-07-10 ENCOUNTER — Telehealth: Payer: Self-pay | Admitting: Emergency Medicine

## 2023-07-10 NOTE — Telephone Encounter (Signed)
PT calling to see if AZ&E form was submitted. He wanted to add is yealy income. It is $ 62,637.00

## 2023-07-16 MED ORDER — BREZTRI AEROSPHERE 160-9-4.8 MCG/ACT IN AERO: 2.0000 | INHALATION_SPRAY | Freq: Two times a day (BID) | RESPIRATORY_TRACT | 10 refills | Status: DC

## 2023-07-16 NOTE — Telephone Encounter (Signed)
Script has been printed. I am putting this with Byrum's stuff so he can sign this tomorrow. Fax number will be put with it. This just needs Byrums signature. Pt has been notified of this. [name].

## 2023-07-16 NOTE — Telephone Encounter (Signed)
ATC pt X1. LMTCB.

## 2023-07-16 NOTE — Telephone Encounter (Signed)
Pt states az&me needs a rx. The application has been approved and he is accepted, but they need a rx for [employer]. Pt has an appointment with Dr Delton Coombes on Thursday.  Please advise

## 2023-07-16 NOTE — Telephone Encounter (Signed)
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

## 2023-07-18 ENCOUNTER — Ambulatory Visit: Payer: Medicare HMO | Admitting: Emergency Medicine

## 2023-07-18 ENCOUNTER — Encounter: Payer: Self-pay | Admitting: Emergency Medicine

## 2023-07-18 VITALS — BP 132/82 | HR 60 | Temp 97.6°F | Ht 70.0 in | Wt 179.4 lb

## 2023-07-18 DIAGNOSIS — R918 Other nonspecific abnormal finding of lung field: Secondary | ICD-10-CM | POA: Diagnosis not present

## 2023-07-18 DIAGNOSIS — J4489 Other specified chronic obstructive pulmonary disease: Secondary | ICD-10-CM | POA: Diagnosis not present

## 2023-07-18 MED ORDER — BREZTRI AEROSPHERE 160-9-4.8 MCG/ACT IN AERO
2.0000 | INHALATION_SPRAY | Freq: Two times a day (BID) | RESPIRATORY_TRACT | Status: AC
Start: 2023-07-18 — End: ?

## 2023-07-18 NOTE — Progress Notes (Signed)
History of Present Illness:   [name] 05/29/2023 --follow-up visit for 68 year old man with a history of COPD/asthma.  Also with hypothyroidism, atrial fibrillation post ablation.  His PFTs show severe obstruction.  Is been managed on Symbicort.  He had a pulmonary nodule noted on a hospitalization in [place] in 2023, showed scattered 2 to 3 mm nodules in the left lower lobe and right lower lobe.  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  He has been a little more SOB - was exposed to some dust when traveling recently to [place]. Had to use his albuterol then - has used it less than 5 times total. He has cough for about 2 weeks, can be random, sometimes when laying down. Non-productive. No nasal gtt or congestion. No reflux sx. He likes symbicort but feels that he should be able to do more.   [name] 07/18/2023 --68 year old man with COPD/asthma, atrial fibrillation with an ablation.  He has severe obstruction on PFT and has been managed on Symbicort.  Also with benign pulmonary nodules that we followed with serial CT scanning.  At his last visit in December I changed his Symbicort to [facility] to see if you get more benefit.  Today he reports that he has noticed a significant improvement w the [place]. His functional capacity has improved. He still has a come/go dry cough, more when he is at home and w dry weather.     Vitals:   07/18/23 1355  BP: 132/82  Pulse: 60  Temp: 97.6 F (36.4 C)  TempSrc: Oral  SpO2: 97%  Weight: 179 lb 6.4 oz (81.4 kg)  Height: 5\' 10"  (1.778 m)    Gen: Pleasant, well-nourished, in no distress,  normal affect  ENT: No lesions,  mouth clear,  oropharynx clear, no postnasal drip  Neck: No JVD, no stridor  Lungs: No use of accessory muscles, clear without rales or rhonchi, no wheeze on regular breath or forced exp.   Cardiovascular: RRR, heart sounds normal, no murmur or gallops, no peripheral  edema  Musculoskeletal: No deformities, no cyanosis or clubbing  Neuro: alert, non focal  Skin: Warm, no lesions or rashes    Pulmonary nodules Deemed benign on serial CTs  COPD with asthma (HCC) We will plan to continue Breztri.  You can use 2 puffs in the morning, 1 puff in the evening unless you feel the need to increase the evening dose to 2 puffs.  Please rinse and gargle after using.  We have enrolled you in the [employer] support program Keep your albuterol available to 2 puffs if needed for shortness of breath, chest tense, wheezing. Follow Dr. Delton Coombes in 1 year, sooner if you have any problems.    Levy Pupa, MD, PhD 07/18/2023, 2:17 PM [facility] [phone] or if no answer [phone]

## 2023-07-18 NOTE — Patient Instructions (Addendum)
We will plan to continue Breztri.  You can use 2 puffs in the morning, 1 puff in the evening unless you feel the need to increase the evening dose to 2 puffs.  Please rinse and gargle after using.  We have enrolled you in the [employer] support program Keep your albuterol available to 2 puffs if needed for shortness of breath, chest tense, wheezing. Follow Dr. Delton Coombes in 1 year, sooner if you have any problems.

## 2023-07-18 NOTE — Assessment & Plan Note (Signed)
Deemed benign on serial CTs

## 2023-07-18 NOTE — Assessment & Plan Note (Signed)
We will plan to continue Breztri.  You can use 2 puffs in the morning, 1 puff in the evening unless you feel the need to increase the evening dose to 2 puffs.  Please rinse and gargle after using.  We have enrolled you in the [employer] support program Keep your albuterol available to 2 puffs if needed for shortness of breath, chest tense, wheezing. Follow Dr. Delton Coombes in 1 year, sooner if you have any problems.

## 2023-07-18 NOTE — Addendum Note (Signed)
Addended by: Gay Filler T on: 07/18/2023 02:52 PM   Modules accepted: Orders

## 2023-07-19 NOTE — Telephone Encounter (Signed)
 [name]

## 2023-08-22 DIAGNOSIS — I480 Paroxysmal atrial fibrillation: Secondary | ICD-10-CM | POA: Diagnosis not present

## 2023-08-22 DIAGNOSIS — D696 Thrombocytopenia, unspecified: Secondary | ICD-10-CM | POA: Diagnosis not present

## 2023-08-22 DIAGNOSIS — R911 Solitary pulmonary nodule: Secondary | ICD-10-CM | POA: Diagnosis not present

## 2023-08-22 DIAGNOSIS — R3589 Other polyuria: Secondary | ICD-10-CM | POA: Diagnosis not present

## 2023-08-22 DIAGNOSIS — I10 Essential (primary) hypertension: Secondary | ICD-10-CM | POA: Diagnosis not present

## 2023-08-22 DIAGNOSIS — J449 Chronic obstructive pulmonary disease, unspecified: Secondary | ICD-10-CM | POA: Diagnosis not present

## 2023-08-22 DIAGNOSIS — E890 Postprocedural hypothyroidism: Secondary | ICD-10-CM | POA: Diagnosis not present

## 2023-08-22 DIAGNOSIS — I25119 Atherosclerotic heart disease of native coronary artery with unspecified angina pectoris: Secondary | ICD-10-CM | POA: Diagnosis not present

## 2023-12-10 DIAGNOSIS — E559 Vitamin D deficiency, unspecified: Secondary | ICD-10-CM | POA: Diagnosis not present

## 2023-12-10 DIAGNOSIS — I10 Essential (primary) hypertension: Secondary | ICD-10-CM | POA: Diagnosis not present

## 2023-12-10 DIAGNOSIS — I25119 Atherosclerotic heart disease of native coronary artery with unspecified angina pectoris: Secondary | ICD-10-CM | POA: Diagnosis not present

## 2023-12-10 DIAGNOSIS — E782 Mixed hyperlipidemia: Secondary | ICD-10-CM | POA: Diagnosis not present

## 2023-12-10 DIAGNOSIS — E538 Deficiency of other specified B group vitamins: Secondary | ICD-10-CM | POA: Diagnosis not present

## 2023-12-10 DIAGNOSIS — Z1212 Encounter for screening for malignant neoplasm of rectum: Secondary | ICD-10-CM | POA: Diagnosis not present

## 2023-12-10 DIAGNOSIS — E890 Postprocedural hypothyroidism: Secondary | ICD-10-CM | POA: Diagnosis not present

## 2023-12-10 DIAGNOSIS — N401 Enlarged prostate with lower urinary tract symptoms: Secondary | ICD-10-CM | POA: Diagnosis not present

## 2023-12-16 DIAGNOSIS — D696 Thrombocytopenia, unspecified: Secondary | ICD-10-CM | POA: Diagnosis not present

## 2023-12-16 DIAGNOSIS — Z1331 Encounter for screening for depression: Secondary | ICD-10-CM | POA: Diagnosis not present

## 2023-12-16 DIAGNOSIS — J449 Chronic obstructive pulmonary disease, unspecified: Secondary | ICD-10-CM | POA: Diagnosis not present

## 2023-12-16 DIAGNOSIS — I25119 Atherosclerotic heart disease of native coronary artery with unspecified angina pectoris: Secondary | ICD-10-CM | POA: Diagnosis not present

## 2023-12-16 DIAGNOSIS — I10 Essential (primary) hypertension: Secondary | ICD-10-CM | POA: Diagnosis not present

## 2023-12-16 DIAGNOSIS — E890 Postprocedural hypothyroidism: Secondary | ICD-10-CM | POA: Diagnosis not present

## 2023-12-16 DIAGNOSIS — R911 Solitary pulmonary nodule: Secondary | ICD-10-CM | POA: Diagnosis not present

## 2023-12-16 DIAGNOSIS — Z0000 Encounter for general adult medical examination without abnormal findings: Secondary | ICD-10-CM | POA: Diagnosis not present

## 2023-12-16 DIAGNOSIS — Z1339 Encounter for screening examination for other mental health and behavioral disorders: Secondary | ICD-10-CM | POA: Diagnosis not present

## 2023-12-16 DIAGNOSIS — R82998 Other abnormal findings in urine: Secondary | ICD-10-CM | POA: Diagnosis not present

## 2023-12-16 DIAGNOSIS — I480 Paroxysmal atrial fibrillation: Secondary | ICD-10-CM | POA: Diagnosis not present

## 2024-01-23 ENCOUNTER — Ambulatory Visit: Admitting: Emergency Medicine

## 2024-01-23 ENCOUNTER — Encounter: Payer: Self-pay | Admitting: Emergency Medicine

## 2024-01-23 VITALS — BP 147/83 | HR 64 | Temp 98.0°F | Ht 70.0 in | Wt 183.2 lb

## 2024-01-23 DIAGNOSIS — J301 Allergic rhinitis due to pollen: Secondary | ICD-10-CM | POA: Diagnosis not present

## 2024-01-23 DIAGNOSIS — R918 Other nonspecific abnormal finding of lung field: Secondary | ICD-10-CM | POA: Diagnosis not present

## 2024-01-23 DIAGNOSIS — J4489 Other specified chronic obstructive pulmonary disease: Secondary | ICD-10-CM

## 2024-01-23 MED ORDER — BREZTRI AEROSPHERE 160-9-4.8 MCG/ACT IN AERO: 2.0000 | INHALATION_SPRAY | Freq: Two times a day (BID) | RESPIRATORY_TRACT | 10 refills | Status: DC

## 2024-01-23 NOTE — Assessment & Plan Note (Signed)
 Deemed benign on serial imaging

## 2024-01-23 NOTE — Assessment & Plan Note (Signed)
 Not active at this time.  He is not on maintenance medications

## 2024-01-23 NOTE — Addendum Note (Signed)
 Addended byBETHA FRIES, Shiryl Ruddy A on: 01/23/2024 02:38 PM   Modules accepted: Orders

## 2024-01-23 NOTE — Progress Notes (Signed)
 History of Present Illness:   [name] 07/18/2023 --68 year old man with COPD/asthma, atrial fibrillation with an ablation.  He has severe obstruction on PFT and has been managed on Symbicort .  Also with benign pulmonary nodules that we followed with serial CT scanning.  At his last visit in December I changed his Symbicort  to Breztri  to see if you get more benefit.  Today he reports that he has noticed a significant improvement w the Breztri . His functional capacity has improved. He still has a come/go dry cough, more when he is at home and w dry weather.   [name] 01/23/2024 --[name] is a 68 and follows up today for COPD/asthma with severe obstruction on spirometry.  He also has a history of atrial fibrillation post ablation and pulmonary nodules that we have followed on serial CT scanning, deemed benign based on repeat imaging He has been managed on Breztri . Today he reports that he has been doing well. No flares. No real changes in his respiratory symptoms. He has noticed some palpitation. Very rare albuterol  use. He likes the Breztri . Good exertional tolerance. Minimal to no cough.    Vitals:   01/23/24 1341  BP: (!) 147/83  Pulse: 64  Temp: 98 F (36.7 C)  TempSrc: Temporal  SpO2: 98%  Weight: 183 lb 3.2 oz (83.1 kg)  Height: 5' 10 (1.778 m)    Gen: Pleasant, well-nourished, in no distress,  normal affect  ENT: No lesions,  mouth clear,  oropharynx clear, no postnasal drip  Neck: No JVD, no stridor  Lungs: No use of accessory muscles, clear without rales or rhonchi, no wheeze on regular breath or forced exp.   Cardiovascular: RRR, heart sounds normal, no murmur or gallops, no peripheral edema  Musculoskeletal: No deformities, no cyanosis or clubbing  Neuro: alert, non focal  Skin: Warm, no lesions or rashes    COPD with asthma (HCC) Very stable interval for [name].  No flares.  Great control and good exercise tolerance.  Very rare albuterol  use.  Plan to continue his Breztri  and  albuterol  as needed.  Follow-up annually or sooner if he has a problem.  Allergic rhinitis Not active at this time.  He is not on maintenance medications  Pulmonary nodules Deemed benign on serial imaging     Lamar Chris, MD, PhD 01/23/2024, 1:58 PM [facility] [phone] or if no answer [phone]

## 2024-01-23 NOTE — Assessment & Plan Note (Signed)
 Very stable interval for [place].  No flares.  Great control and good exercise tolerance.  Very rare albuterol  use.  Plan to continue his Breztri  and albuterol  as needed.  Follow-up annually or sooner if he has a problem.

## 2024-01-23 NOTE — Patient Instructions (Signed)
 Please continue Breztri  2 puffs twice a day.  Rinse and gargle after using. Keep your albuterol  available to use 2 puffs if needed for shortness of breath, chest tightness, wheezing. Continue your exercise and diet routines. Follow Dr. Shelah in 1 year, sooner if you have any problems.

## 2024-04-03 ENCOUNTER — Telehealth: Payer: Self-pay

## 2024-04-03 ENCOUNTER — Other Ambulatory Visit: Payer: Self-pay

## 2024-04-03 MED ORDER — BREZTRI AEROSPHERE 160-9-4.8 MCG/ACT IN AERO: 2.0000 | INHALATION_SPRAY | Freq: Two times a day (BID) | RESPIRATORY_TRACT | 10 refills | Status: DC

## 2024-04-03 NOTE — Telephone Encounter (Signed)
 RX printed and faxed to [facility]

## 2024-04-28 DIAGNOSIS — S76312A Strain of muscle, fascia and tendon of the posterior muscle group at thigh level, left thigh, initial encounter: Secondary | ICD-10-CM | POA: Diagnosis not present

## 2024-05-07 DIAGNOSIS — S76312A Strain of muscle, fascia and tendon of the posterior muscle group at thigh level, left thigh, initial encounter: Secondary | ICD-10-CM | POA: Diagnosis not present

## 2024-05-22 ENCOUNTER — Telehealth: Payer: Self-pay

## 2024-05-22 NOTE — Telephone Encounter (Signed)
 Called and spoke with patient concerning his need to schedule recall colonoscopy; patient has been scheduled for his PV in [address] on 06/03/2024 at 1230 and his colonoscopy with Dr. Abran on 06/25/2023 Wed 0830;  PV chart to be completed;

## 2024-06-03 ENCOUNTER — Ambulatory Visit

## 2024-06-03 ENCOUNTER — Encounter

## 2024-06-03 VITALS — Ht 70.0 in | Wt 172.0 lb

## 2024-06-03 DIAGNOSIS — Z860100 Personal history of colon polyps, unspecified: Secondary | ICD-10-CM

## 2024-06-03 MED ORDER — NA SULFATE-K SULFATE-MG SULF 17.5-3.13-1.6 GM/177ML PO SOLN: 1.0000 | Freq: Once | ORAL | 0 refills | Status: AC

## 2024-06-03 NOTE — Progress Notes (Signed)
 No issues known to pt with past sedation with any surgeries or procedures Patient denies ever being told they had issues or difficulty with intubation  No FH of Malignant Hyperthermia Pt is not on diet pills nor GLP-1 medication Pt is not on home 02  Pt is not on blood thinners  Pt denies issues with chronic constipation  No A fib or A flutter Have any cardiac testing pending--no Pt instructed to use [email or web address] or GoodRx for a price reduction on prep  Ambulates independently

## 2024-06-22 ENCOUNTER — Encounter: Payer: Self-pay | Admitting: Internal Medicine

## 2024-06-24 ENCOUNTER — Encounter: Payer: Self-pay | Admitting: Internal Medicine

## 2024-06-24 ENCOUNTER — Ambulatory Visit: Admitting: Internal Medicine

## 2024-06-24 VITALS — BP 132/87 | HR 66 | Temp 97.9°F | Resp 17 | Ht 70.0 in | Wt 172.0 lb

## 2024-06-24 DIAGNOSIS — D122 Benign neoplasm of ascending colon: Secondary | ICD-10-CM

## 2024-06-24 DIAGNOSIS — K5730 Diverticulosis of large intestine without perforation or abscess without bleeding: Secondary | ICD-10-CM

## 2024-06-24 DIAGNOSIS — Z860100 Personal history of colon polyps, unspecified: Secondary | ICD-10-CM

## 2024-06-24 DIAGNOSIS — Z860101 Personal history of adenomatous and serrated colon polyps: Secondary | ICD-10-CM

## 2024-06-24 DIAGNOSIS — Z1211 Encounter for screening for malignant neoplasm of colon: Secondary | ICD-10-CM

## 2024-06-24 MED ORDER — SODIUM CHLORIDE 0.9 % IV SOLN: 500.0000 mL | Freq: Once | INTRAVENOUS | Status: DC

## 2024-06-24 NOTE — Progress Notes (Signed)
 Called to room to assist during endoscopic procedure.  Patient ID and intended procedure confirmed with present staff. Received instructions for my participation in the procedure from the performing physician.

## 2024-06-24 NOTE — Progress Notes (Signed)
 HISTORY OF PRESENT ILLNESS:  [name] is a 69 y.o. male with a history of multiple advanced adenomatous colon polyps.  After surveillance colonoscopy  REVIEW OF SYSTEMS:  All non-GI ROS negative except for  Past Medical History:  Diagnosis Date   Asthma    CHF (congestive heart failure) (HCC)    Chronic hepatitis C (HCC)    COPD (chronic obstructive pulmonary disease) (HCC)    Dizziness    Hepatitis C    Hyperthyroidism    Syncope and collapse    Thrombocytopenia     Past Surgical History:  Procedure Laterality Date   COLONOSCOPY  10/27/2015   POLYPECTOMY      Social History [name]  reports that he quit smoking about 38 years ago. His smoking use included cigarettes. He started smoking about 58 years ago. He has a 20 pack-year smoking history. He has never used smokeless tobacco. He reports that he does not drink alcohol  and does not use drugs.  family history includes Diabetes in his mother.  Allergies[1]     PHYSICAL EXAMINATION: Vital signs: BP 128/87   Pulse 63   Temp 97.9 F (36.6 C)   Ht 5' 10 (1.778 m)   Wt 172 lb (78 kg)   SpO2 98%   BMI 24.68 kg/m  General: Well-developed, well-nourished, no acute distress HEENT: Sclerae are anicteric, conjunctiva pink. Oral mucosa intact Lungs: Clear Heart: Regular Abdomen: soft, nontender, nondistended, no obvious ascites, no peritoneal signs, normal bowel sounds. No organomegaly. Extremities: No edema Psychiatric: alert and oriented x3. Cooperative     ASSESSMENT:  History of adenomatous polyps   PLAN:   Surveillance colonoscopy        [1]  Allergies Allergen Reactions   Atenolol Other (See Comments)    Other reaction(s): Other - destroyed WBCs, per pt.   Methimazole Other (See Comments)    Other reaction(s): Unknown   Other Other (See Comments)    Med for hypothyroidism. Killed white blood cells.--?Atenol   Theophyllines Other (See Comments)

## 2024-06-24 NOTE — Progress Notes (Signed)
 Sedate, gd SR, tolerated procedure well, VSS, report to RN

## 2024-06-24 NOTE — Progress Notes (Signed)
 Pt's states no medical or surgical changes since previsit or office visit.

## 2024-06-24 NOTE — Patient Instructions (Signed)
Handouts provided on polyps and diverticulosis.   Resume previous diet.  Continue present medications.  Await pathology results.  Repeat colonoscopy in 5 years for surveillance.   YOU HAD AN ENDOSCOPIC PROCEDURE TODAY AT [facility]:   Refer to the procedure report that was given to you for any specific questions about what was found during the examination.  If the procedure report does not answer your questions, please call your gastroenterologist to clarify.  If you requested that your care partner not be given the details of your procedure findings, then the procedure report has been included in a sealed envelope for you to review at your convenience later.  YOU SHOULD EXPECT: Some feelings of bloating in the abdomen. Passage of more gas than usual.  Walking can help get rid of the air that was put into your GI tract during the procedure and reduce the bloating. If you had a lower endoscopy (such as a colonoscopy or flexible sigmoidoscopy) you may notice spotting of blood in your stool or on the toilet paper. If you underwent a bowel prep for your procedure, you may not have a normal bowel movement for a few days.  Please Note:  You might notice some irritation and congestion in your nose or some drainage.  This is from the oxygen used during your procedure.  There is no need for concern and it should clear up in a day or so.  SYMPTOMS TO REPORT IMMEDIATELY:  Following lower endoscopy (colonoscopy or flexible sigmoidoscopy):  Excessive amounts of blood in the stool  Significant tenderness or worsening of abdominal pains  Swelling of the abdomen that is new, acute  Fever of 100F or higher  For urgent or emergent issues, a gastroenterologist can be reached at any hour by calling [phone]. Do not use MyChart messaging for urgent concerns.    DIET:  We do recommend a small meal at first, but then you may proceed to your regular diet.  Drink plenty of fluids but you  should avoid alcoholic beverages for 24 hours.  ACTIVITY:  You should plan to take it easy for the rest of today and you should NOT DRIVE or use heavy machinery until tomorrow (because of the sedation medicines used during the test).    FOLLOW UP: Our staff will call the number listed on your records the next business day following your procedure.  We will call around 7:15- 8:00 am to check on you and address any questions or concerns that you may have regarding the information given to you following your procedure. If we do not reach you, we will leave a message.     If any biopsies were taken you will be contacted by phone or by letter within the next 1-3 weeks.  Please call us at [phone] if you have not heard about the biopsies in 3 weeks.    SIGNATURES/CONFIDENTIALITY: You and/or your care partner have signed paperwork which will be entered into your electronic medical record.  These signatures attest to the fact that that the information above on your After Visit Summary has been reviewed and is understood.  Full responsibility of the confidentiality of this discharge information lies with you and/or your care-partner.

## 2024-06-24 NOTE — Op Note (Signed)
 [facility] Patient Name: Skip Litke Procedure Date: 06/24/2024 10:26 AM MRN: [id number] Endoscopist: Norleen SAILOR. Abran , MD, [id number] Age: 69 Referring MD:  Date of Birth: [birth year] Gender: Male Account #: [id number] Procedure:                Colonoscopy with cold snare polypectomy x 2 Indications:              High risk colon cancer surveillance: Personal                            history of adenoma (10 mm or greater in size), High                            risk colon cancer surveillance: Personal history of                            multiple (3 or more) adenomas. Previous                            examinations 2017 ([place] ); 2020 (here) Medicines:                Monitored Anesthesia Care Procedure:                Pre-Anesthesia Assessment:                           - Prior to the procedure, a History and Physical                            was performed, and patient medications and                            allergies were reviewed. The patient's tolerance of                            previous anesthesia was also reviewed. The risks                            and benefits of the procedure and the sedation                            options and risks were discussed with the patient.                            All questions were answered, and informed consent                            was obtained. Prior Anticoagulants: The patient has                            taken no anticoagulant or antiplatelet agents. ASA                            Grade Assessment: II - A patient with mild systemic  disease. After reviewing the risks and benefits,                            the patient was deemed in satisfactory condition to                            undergo the procedure.                           After obtaining informed consent, the colonoscope                            was passed under direct vision. Throughout the                             procedure, the patient's blood pressure, pulse, and                            oxygen saturations were monitored continuously. The                            Olympus Scope [id number] was introduced through the                            anus and advanced to the the cecum, identified by                            appendiceal orifice and ileocecal valve. The                            ileocecal valve, appendiceal orifice, and rectum                            were photographed. The quality of the bowel                            preparation was excellent. The colonoscopy was                            performed without difficulty. The patient tolerated                            the procedure well. The bowel preparation used was                            SUPREP via split dose instruction. Scope In: 10:33:23 AM Scope Out: 10:48:27 AM Scope Withdrawal Time: 0 hours 10 minutes 3 seconds  Total Procedure Duration: 0 hours 15 minutes 4 seconds  Findings:                 Two polyps were found in the ascending colon. The                            polyps were 2 to 5 mm in size. These polyps  were                            removed with a cold snare. Resection and retrieval                            were complete.                           A few diverticula were found in the right colon.                           The exam was otherwise without abnormality on                            direct and retroflexion views. Complications:            No immediate complications. Estimated blood loss:                            None. Estimated Blood Loss:     Estimated blood loss: none. Impression:               - Two 2 to 5 mm polyps in the ascending colon,                            removed with a cold snare. Resected and retrieved.                           - Diverticulosis in the right colon.                           - The examination was otherwise normal on direct                            and  retroflexion views. Recommendation:           - Repeat colonoscopy in 5 years for surveillance.                           - Patient has a contact number available for                            emergencies. The signs and symptoms of potential                            delayed complications were discussed with the                            patient. Return to normal activities tomorrow.                            Written discharge instructions were provided to the                            patient.                           -  Resume previous diet.                           - Continue present medications.                           - Await pathology results. Norleen SAILOR. Abran, MD 06/24/2024 10:52:46 AM This report has been signed electronically.

## 2024-06-25 ENCOUNTER — Telehealth: Payer: Self-pay | Admitting: *Deleted

## 2024-06-25 NOTE — Telephone Encounter (Signed)
" °  Follow up Call-     06/24/2024    9:17 AM  Call back number  Post procedure Call Back phone  # [phone]  Permission to leave phone message Yes     Patient questions:  Do you have a fever, pain , or abdominal swelling? No. Pain Score  0 *  Have you tolerated food without any problems? Yes.    Have you been able to return to your normal activities? Yes.    Do you have any questions about your discharge instructions: Diet   No. Medications  No. Follow up visit  No.  Do you have questions or concerns about your Care? No.  Actions: * If pain score is 4 or above: No action needed, pain <4.   "

## 2024-06-26 ENCOUNTER — Ambulatory Visit: Payer: Self-pay | Admitting: Internal Medicine

## 2024-06-26 LAB — SURGICAL PATHOLOGY

## 2024-06-29 ENCOUNTER — Encounter: Payer: Self-pay | Admitting: Adult Health

## 2024-06-29 ENCOUNTER — Ambulatory Visit: Payer: Self-pay

## 2024-06-29 ENCOUNTER — Ambulatory Visit: Payer: Self-pay | Admitting: Adult Health

## 2024-06-29 ENCOUNTER — Ambulatory Visit: Admitting: Adult Health

## 2024-06-29 ENCOUNTER — Ambulatory Visit (INDEPENDENT_AMBULATORY_CARE_PROVIDER_SITE_OTHER)

## 2024-06-29 VITALS — BP 124/78 | HR 75 | Temp 99.2°F | Ht 70.0 in | Wt 180.0 lb

## 2024-06-29 DIAGNOSIS — Z87891 Personal history of nicotine dependence: Secondary | ICD-10-CM

## 2024-06-29 DIAGNOSIS — J4489 Other specified chronic obstructive pulmonary disease: Secondary | ICD-10-CM

## 2024-06-29 DIAGNOSIS — J45909 Unspecified asthma, uncomplicated: Secondary | ICD-10-CM

## 2024-06-29 DIAGNOSIS — J441 Chronic obstructive pulmonary disease with (acute) exacerbation: Secondary | ICD-10-CM | POA: Diagnosis not present

## 2024-06-29 MED ORDER — AZITHROMYCIN 250 MG PO TABS: ORAL_TABLET | ORAL | 0 refills | Status: AC

## 2024-06-29 MED ORDER — PREDNISONE 20 MG PO TABS: 20.0000 mg | ORAL_TABLET | Freq: Every day | ORAL | 0 refills | Status: AC

## 2024-06-29 MED ORDER — ALBUTEROL SULFATE (2.5 MG/3ML) 0.083% IN NEBU
2.5000 mg | INHALATION_SOLUTION | Freq: Once | RESPIRATORY_TRACT | Status: AC
  Administered 2024-06-29: 2.5 mg via RESPIRATORY_TRACT

## 2024-06-29 NOTE — Progress Notes (Signed)
 "  @Patient  ID: [name], male    DOB: [birth year], 69 y.o.   MRN: [id number]  Chief Complaint  Patient presents with   Acute Visit    Cough, SOB    Referring provider: Nichole Senior, MD  HPI: 69 year old male former smoker followed for severe COPD with asthma, pulmonary nodules  Medical History significant for high output congestive heart failure and pulmonary hypertension, A Fib , Hx of Hep C (Tx)    TEST/EVENTS : Reviewed 06/29/2024  PFT 2011 FEV1 42, ratio 43, ++ BD response    CT chest June 20, 2023 stable small bilateral pulmonary nodules, chronic atelectasis right middle lobe    Discussed the use of AI scribe software for clinical note transcription with the patient, who gave verbal consent to proceed.  History of Present Illness [name] is a 69 year old male with COPD who presents with worsening respiratory symptoms for last 5-6 days.   He has been experiencing worsening respiratory symptoms for approximately five days, beginning with shortness of breath, followed by a sore throat and rhinorrhea, both of which resolved. He then developed a low-grade fever and a cough. Some body aches.   He has not received a flu or COVID-19 vaccination.  For his COPD, he uses Breztri , two puffs in the morning and two puffs in the evening, which he finds effective. He also has an albuterol  inhaler prescribed as needed, which he has not used in the last two years, including during this current episode. He reports a little wheezing but no hemoptysis, vomiting, or abdominal pain. He states he is eating and drinking adequately.  CT chest June 20, 2023 showed no significant changes in small nodules or scarring. He quit smoking in the 1980s. He works as a veterinary surgeon, conducting group sessions for anger management, mediation, and substance abuse.        Allergies[1]  Immunization History  Administered Date(s) Administered   Influenza Split 09/01/2012, 06/18/2013, 07/14/2013    Tdap 01/30/2022    Past Medical History:  Diagnosis Date   Asthma    CHF (congestive heart failure) (HCC)    Chronic hepatitis C (HCC)    COPD (chronic obstructive pulmonary disease) (HCC)    Dizziness    Hepatitis C    Hyperthyroidism    Syncope and collapse    Thrombocytopenia     Tobacco History: Tobacco Use History[2] Counseling given: Not Answered   Outpatient Medications Prior to Visit  Medication Sig Dispense Refill   albuterol  (VENTOLIN  HFA) 108 (90 Base) MCG/ACT inhaler Inhale 2 puffs into the lungs every 6 (six) hours as needed for wheezing or shortness of breath. 8 g 2   budesonide -glycopyrrolate -formoterol  (BREZTRI  AEROSPHERE) 160-9-4.8 MCG/ACT AERO inhaler Inhale 2 puffs into the lungs in the morning and at bedtime. 10.7 g 10   Cyanocobalamin  (VITAMIN B-12) 5000 MCG SUBL daily.     levothyroxine (SYNTHROID, LEVOTHROID) 100 MCG tablet Take 100 mcg by mouth. 6 days 1 tab/1 1/2 tab on the 7th day     aspirin  EC 81 MG tablet Take 1 tablet (81 mg total) by mouth daily. Swallow whole. (Patient not taking: Reported on 06/29/2024) 90 tablet 3   Cholecalciferol 50 MCG (2000 UT) TABS daily. (Patient not taking: Reported on 06/29/2024)     No facility-administered medications prior to visit.     Review of Systems:   Constitutional:   No  weight loss, night sweats,  +Fevers, chills, fatigue, or  lassitude.  HEENT:   No headaches,  Difficulty swallowing,  Tooth/dental problems, or  Sore throat,                No sneezing, itching, ear ache,+ nasal congestion, post nasal drip,   CV:  No chest pain,  Orthopnea, PND, swelling in lower extremities, anasarca, dizziness, palpitations, syncope.   GI  No heartburn, indigestion, abdominal pain, nausea, vomiting, diarrhea, change in bowel habits, loss of appetite, bloody stools.   Resp:   No chest wall deformity  Skin: no rash or lesions.  GU: no dysuria, change in color of urine, no urgency or frequency.  No flank pain, no  hematuria   MS:  No joint pain or swelling.  No decreased range of motion.  No back pain.    Physical Exam  BP (!) 144/72   Pulse 75   Temp 99.2 F (37.3 C)   Ht 5' 10 (1.778 m) Comment: Per pt  Wt 180 lb (81.6 kg)   SpO2 98% Comment: RA  BMI 25.83 kg/m   GEN: A/Ox3; pleasant , NAD, well nourished    HEENT:  [place]/AT,   NOSE-clear, THROAT-clear, no lesions, no postnasal drip or exudate noted.   NECK:  Supple w/ fair ROM; no JVD; normal carotid impulses w/o bruits; no thyromegaly or nodules palpated; no lymphadenopathy.    RESP few trace rhonchi and wheezing, speaks in full sentences  no accessory muscle use, no dullness to percussion  CARD:  RRR, no m/r/g, no peripheral edema, pulses intact, no cyanosis or clubbing.  GI:   Soft & nt; nml bowel sounds; no organomegaly or masses detected.   Musco: Warm bil, no deformities or joint swelling noted.   Neuro: alert, no focal deficits noted.    Skin: Warm, no lesions or rashes    Lab Results:Reviewed 06/29/2024   CBC  BMET   BNP No results found for: BNP  ProBNP   Imaging: No results found.  Administration History     None           No data to display          No results found for: NITRICOXIDE      No data to display              Assessment & Plan:   Assessment and Plan Assessment & Plan Acute exacerbation of chronic obstructive pulmonary disease with asthma   The acute exacerbation is likely due to URI  including sore throat, rhinorrhea, and low-grade fever, which began six days ago. Wheezing is present on examination.  Unable to test for flu or COVID today in the office.  Patient advised may check home flu and COVID call back if positive  There is no hemoptysis or vomiting, and he is eating and drinking well.  Continue Breztri , two puffs in the morning and evening. Refill albuterol  inhaler and use as needed. Administered nebulizer treatment with albuterol . Ordered chest x-ray to rule  out pneumonia. Prescribed azithromycin  (Z-Pak), two tablets on the first day, then one tablet daily. Prescribed prednisone  20 mg once daily for five days. Recommend over-the-counter liquid Mucinex DM for cough. Use acetaminophen for low-grade fever.  Albuterol  nebulizer given in the office. Check PFTs on return visit   Plan  Patient Instructions  Begin Zpack take as directed Prednisone  20mg  daily for 5 days  Liquid Mucinex DM Twice daily  As needed  cough/congestion  Fluids and rest  Tylenol As needed   Chest xray today  Continue on Breztri  2 puffs Twice daily,  rinse after use.  Albuterol  inhaler As needed   Follow up with Dr. Shelah  in 6 months and As needed   Please contact office for sooner follow up if symptoms do not improve or worsen or seek emergency care             Tacarra Justo, NP 06/29/2024      [1]  Allergies Allergen Reactions   Atenolol Other (See Comments)    Other reaction(s): Other - destroyed WBCs, per pt.   Methimazole Other (See Comments)    Other reaction(s): Unknown   Other Other (See Comments)    Med for hypothyroidism. Killed white blood cells.--?Atenol   Theophyllines Other (See Comments)  [2]  Social History Tobacco Use  Smoking Status Former   Current packs/day: 0.00   Average packs/day: 1 pack/day for 20.0 years (20.0 ttl pk-yrs)   Types: Cigarettes   Start date: 06/18/1966   Quit date: 06/18/1986   Years since quitting: 38.0  Smokeless Tobacco Never   "

## 2024-06-29 NOTE — Telephone Encounter (Signed)
 FYI Only or Action Required?: Action required by provider: request for appointment.  Patient was last seen in primary care on .  Called Nurse Triage reporting Cough.  Symptoms began [name] week ago.  Interventions attempted: Prescription medications: Breztri .  Symptoms are: gradually worsening. Cough and SOB Worsening. Cough keeping pt. Up at night. Can't cough anything up.  Triage Disposition: See HCP Within 4 Hours (Or PCP Triage)  Patient/caregiver understands and will follow disposition?: Yes      Copied from CRM #[id number]. Topic: Clinical - Red Word Triage >> Jun 29, 2024  8:19 AM Norman [name] wrote: Red Word that prompted transfer to Nurse Triage: pt requesting rx for [name] non productive  cough that is not getting better states it causing SOB, also reports SOB w/ exertion Reason for Disposition  [1] MILD difficulty breathing (e.g., minimal/no SOB at rest, SOB with walking, pulse < 100) AND [2] still present when not coughing  Answer Assessment - Initial Assessment Questions 1. ONSET: When did the cough begin?      Last week 2. SEVERITY: How bad is the cough today?      severe 3. SPUTUM: Describe the color of your sputum (e.g., none, dry cough; clear, white, yellow, green)     none 4. HEMOPTYSIS: Are you coughing up any blood? If Yes, ask: How much? (e.g., flecks, streaks, tablespoons, etc.)     no 5. DIFFICULTY BREATHING: Are you having difficulty breathing? If Yes, ask: How bad is it? (e.g., mild, moderate, severe)      With exertion and cough 6. FEVER: Do you have [name] fever? If Yes, ask: What is your temperature, how was it measured, and when did it start?     no 7. CARDIAC HISTORY: Do you have any history of heart disease? (e.g., heart attack, congestive heart failure)      no 8. LUNG HISTORY: Do you have any history of lung disease?  (e.g., pulmonary embolus, asthma, emphysema)     yes 9. PE RISK FACTORS: Do you have [name] history of blood clots? (or:  recent major surgery, recent prolonged travel, bedridden)     no 10. OTHER SYMPTOMS: Do you have any other symptoms? (e.g., runny nose, wheezing, chest pain)       wheezing 11. PREGNANCY: Is there any chance you are pregnant? When was your last menstrual period?       N/[name] 12. TRAVEL: Have you traveled out of the country in the last month? (e.g., travel history, exposures)       no  Protocols used: Cough - Acute Non-Productive-[name]-AH

## 2024-06-29 NOTE — Patient Instructions (Addendum)
 Begin Zpack take as directed Prednisone  20mg  daily for 5 days  Liquid Mucinex DM Twice daily  As needed  cough/congestion  Fluids and rest  Tylenol As needed   Chest xray today  Continue on Breztri  2 puffs Twice daily, rinse after use.  Albuterol  inhaler As needed   Follow up with Dr. Shelah  in 6 months and As needed   Please contact office for sooner follow up if symptoms do not improve or worsen or seek emergency care

## 2024-07-02 ENCOUNTER — Telehealth: Payer: Self-pay

## 2024-07-02 MED ORDER — BREZTRI AEROSPHERE 160-9-4.8 MCG/ACT IN AERO: 2.0000 | INHALATION_SPRAY | Freq: Two times a day (BID) | RESPIRATORY_TRACT | 10 refills | Status: AC

## 2024-07-02 NOTE — Telephone Encounter (Signed)
 Fax confirmation received, [name]

## 2024-07-02 NOTE — Telephone Encounter (Signed)
 Copied from CRM #[id number]. Topic: Clinical - Medication Question >> Jun 29, 2024  3:09 PM [name] wrote: Reason for CRM: pt called in stating he needs [name] new RX sent to [facility] for budesonide -glycopyrrolate -formoterol  (BREZTRI  AEROSPHERE) 160-9-4.8 MCG/ACT AERO inhaler  fax# [phone]   I called and spoke to pt. Pt confirmed that he only needed [name] new rx sent to [facility]. I informed pt I would do this. Pt verbalized understanding. [name]

## 2024-07-02 NOTE — Telephone Encounter (Signed)
 Faxed signed Rx from Tammy Parrett to [facility] at [phone]. Awaiting fax confirmation.

## 2024-07-15 ENCOUNTER — Telehealth: Payer: Self-pay

## 2024-07-15 ENCOUNTER — Other Ambulatory Visit: Payer: Self-pay

## 2024-07-15 MED ORDER — ALBUTEROL SULFATE HFA 108 (90 BASE) MCG/ACT IN AERS: 2.0000 | INHALATION_SPRAY | Freq: Four times a day (QID) | RESPIRATORY_TRACT | 2 refills | Status: AC | PRN

## 2024-07-15 NOTE — Telephone Encounter (Signed)
 Copied from CRM [id number]. Topic: Clinical - Medication Refill >> Jul 10, 2024  2:44 PM Rozanna MATSU wrote: Medication: albuterol  (VENTOLIN  HFA) 108 (90 Base) MCG/ACT inhaler  Has the patient contacted their pharmacy? No (Agent: If no, request that the patient contact the pharmacy for the refill. If patient does not wish to contact the pharmacy document the reason why and proceed with request.) (Agent: If yes, when and what did the pharmacy advise?)  This is the patient's preferred pharmacy:  [facility] [address], [place] - [address] [place] [address] Phone: [phone] Fax: [phone]  Is this the correct pharmacy for this prescription? Yes If no, delete pharmacy and type the correct one.   Has the prescription been filled recently? No  Is the patient out of the medication? Yes  Has the patient been seen for an appointment in the last year OR does the patient have an upcoming appointment? Yes  Can we respond through MyChart? Yes  Agent: Please be advised that Rx refills may take up to 3 business days. We ask that you follow-up with your pharmacy.    VBU, Refill sent to pharmacy

## 2024-09-30 ENCOUNTER — Ambulatory Visit: Admitting: Internal Medicine
